# Patient Record
Sex: Male | Born: 1990 | Race: White | Hispanic: No | Marital: Single | Smoking: Current some day smoker
Health system: Southern US, Community
[De-identification: ages and names within clinical notes are randomized; demographics above are authoritative.]

## PROBLEM LIST (undated history)

## (undated) DIAGNOSIS — S069XAA Unspecified intracranial injury with loss of consciousness status unknown, initial encounter: Secondary | ICD-10-CM

## (undated) DIAGNOSIS — F4310 Post-traumatic stress disorder, unspecified: Secondary | ICD-10-CM

## (undated) DIAGNOSIS — W3400XA Accidental discharge from unspecified firearms or gun, initial encounter: Secondary | ICD-10-CM

## (undated) DIAGNOSIS — T8859XA Other complications of anesthesia, initial encounter: Secondary | ICD-10-CM

## (undated) DIAGNOSIS — N289 Disorder of kidney and ureter, unspecified: Secondary | ICD-10-CM

## (undated) DIAGNOSIS — C8590 Non-Hodgkin lymphoma, unspecified, unspecified site: Secondary | ICD-10-CM

## (undated) DIAGNOSIS — IMO0002 Reserved for concepts with insufficient information to code with codable children: Secondary | ICD-10-CM

## (undated) DIAGNOSIS — G8921 Chronic pain due to trauma: Secondary | ICD-10-CM

## (undated) DIAGNOSIS — S069X9A Unspecified intracranial injury with loss of consciousness of unspecified duration, initial encounter: Secondary | ICD-10-CM

## (undated) DIAGNOSIS — T4145XA Adverse effect of unspecified anesthetic, initial encounter: Secondary | ICD-10-CM

## (undated) HISTORY — DX: Reserved for concepts with insufficient information to code with codable children: IMO0002

## (undated) HISTORY — PX: HEAD & NECK SKIN LESION EXCISIONAL BIOPSY: SUR472

## (undated) HISTORY — PX: OTHER SURGICAL HISTORY: SHX169

---

## 2009-08-01 ENCOUNTER — Emergency Department (HOSPITAL_COMMUNITY): Admission: EM | Admit: 2009-08-01 | Discharge: 2009-08-01 | Payer: Self-pay | Admitting: Family Medicine

## 2009-08-08 ENCOUNTER — Encounter: Admission: RE | Admit: 2009-08-08 | Discharge: 2009-09-08 | Payer: Self-pay | Admitting: Surgery

## 2011-05-12 ENCOUNTER — Emergency Department (HOSPITAL_COMMUNITY)

## 2011-05-12 ENCOUNTER — Emergency Department (HOSPITAL_COMMUNITY)
Admission: EM | Admit: 2011-05-12 | Discharge: 2011-05-12 | Disposition: A | Discharge status: Home / Self Care | Attending: Emergency Medicine | Admitting: Emergency Medicine

## 2011-05-12 DIAGNOSIS — R221 Localized swelling, mass and lump, neck: Secondary | ICD-10-CM | POA: Insufficient documentation

## 2011-05-12 DIAGNOSIS — R220 Localized swelling, mass and lump, head: Secondary | ICD-10-CM | POA: Insufficient documentation

## 2011-05-12 DIAGNOSIS — F4310 Post-traumatic stress disorder, unspecified: Secondary | ICD-10-CM | POA: Insufficient documentation

## 2011-05-12 DIAGNOSIS — R599 Enlarged lymph nodes, unspecified: Secondary | ICD-10-CM | POA: Insufficient documentation

## 2011-05-12 DIAGNOSIS — G4700 Insomnia, unspecified: Secondary | ICD-10-CM | POA: Insufficient documentation

## 2011-05-12 LAB — COMPREHENSIVE METABOLIC PANEL
Alkaline Phosphatase: 215 U/L — ABNORMAL HIGH (ref 39–117)
BUN: 12 mg/dL (ref 6–23)
CO2: 30 mEq/L (ref 19–32)
GFR calc non Af Amer: >60 mL/min (ref >60)
Glucose, Bld: 95 mg/dL (ref 70–99)
Potassium: 4.1 mEq/L (ref 3.5–5.1)
Total Bilirubin: 0.4 mg/dL (ref 0.3–1.2)
Total Protein: 9.1 g/dL — ABNORMAL HIGH (ref 6.0–8.3)

## 2011-05-12 LAB — DIFFERENTIAL
Eosinophils Relative: 1 % (ref 0–5)
Lymphocytes Relative: 9 % — ABNORMAL LOW (ref 12–46)
Lymphs Abs: 1.8 10*3/uL (ref 0.7–4.0)
Monocytes Absolute: 1.4 10*3/uL — ABNORMAL HIGH (ref 0.1–1.0)

## 2011-05-12 LAB — CBC
HCT: 42.8 % (ref 39.0–52.0)
MCHC: 36.7 g/dL — ABNORMAL HIGH (ref 30.0–36.0)
MCV: 87.7 fL (ref 78.0–100.0)
RDW: 13.6 % (ref 11.5–15.5)
WBC: 20.9 10*3/uL — ABNORMAL HIGH (ref 4.0–10.5)

## 2011-05-12 MED ORDER — IOHEXOL 300 MG/ML  SOLN
75.0000 mL | Freq: Once | INTRAMUSCULAR | Status: AC | PRN
  Administered 2011-05-12: 75 mL via INTRAVENOUS

## 2011-05-12 MED ORDER — IOHEXOL 300 MG/ML  SOLN: 75.0000 mL | Freq: Once | INTRAMUSCULAR | Status: DC | PRN

## 2011-05-22 ENCOUNTER — Ambulatory Visit: Payer: Self-pay | Admitting: Hematology & Oncology

## 2011-06-06 ENCOUNTER — Ambulatory Visit (HOSPITAL_BASED_OUTPATIENT_CLINIC_OR_DEPARTMENT_OTHER): Payer: Non-veteran care | Admitting: Hematology & Oncology

## 2011-06-06 DIAGNOSIS — C8448 Peripheral T-cell lymphoma, not classified, lymph nodes of multiple sites: Secondary | ICD-10-CM

## 2011-06-06 DIAGNOSIS — F4310 Post-traumatic stress disorder, unspecified: Secondary | ICD-10-CM

## 2011-06-06 DIAGNOSIS — Z807 Family history of other malignant neoplasms of lymphoid, hematopoietic and related tissues: Secondary | ICD-10-CM

## 2011-06-15 ENCOUNTER — Ambulatory Visit (HOSPITAL_COMMUNITY): Payer: Non-veteran care | Attending: Hematology & Oncology

## 2011-06-15 ENCOUNTER — Other Ambulatory Visit: Payer: Self-pay | Admitting: Hematology & Oncology

## 2011-06-15 DIAGNOSIS — C8589 Other specified types of non-Hodgkin lymphoma, extranodal and solid organ sites: Secondary | ICD-10-CM | POA: Insufficient documentation

## 2011-06-15 LAB — CBC
MCV: 86.5 fL (ref 78.0–100.0)
Platelets: 278 10*3/uL (ref 150–400)
RBC: 4.96 MIL/uL (ref 4.22–5.81)
WBC: 18.6 10*3/uL — ABNORMAL HIGH (ref 4.0–10.5)

## 2011-06-15 LAB — DIFFERENTIAL
Lymphocytes Relative: 11 % — ABNORMAL LOW (ref 12–46)
Lymphs Abs: 2 10*3/uL (ref 0.7–4.0)
Neutro Abs: 15.2 10*3/uL — ABNORMAL HIGH (ref 1.7–7.7)
Neutrophils Relative %: 82 % — ABNORMAL HIGH (ref 43–77)

## 2011-06-26 ENCOUNTER — Encounter (HOSPITAL_BASED_OUTPATIENT_CLINIC_OR_DEPARTMENT_OTHER): Payer: Non-veteran care | Admitting: Hematology & Oncology

## 2011-06-26 DIAGNOSIS — F4310 Post-traumatic stress disorder, unspecified: Secondary | ICD-10-CM

## 2011-06-26 DIAGNOSIS — IMO0002 Reserved for concepts with insufficient information to code with codable children: Secondary | ICD-10-CM

## 2011-06-30 NOTE — Op Note (Signed)
  NAMEWITTEN, CERTAIN                 ACCOUNT NO.:  [id number]  MEDICAL RECORD NO.:  [id number]  LOCATION:                                 FACILITY:  PHYSICIAN:  Josph Macho, M.D.  DATE OF BIRTH:  05/05/1991  DATE OF PROCEDURE:  06/15/2011 DATE OF DISCHARGE:                              OPERATIVE REPORT   NATURE OF PROCEDURE:  Left posterior iliac crest bone marrow biopsy and aspirate.  Mr. Veith was brought to short-stay unit.  He is a 20 year old gentleman with PTSD.  He has recent diagnosis of anaplastic large cell lymphoma.  He had an IV placed in his, I think, right arm without any difficulties.  He was placed on to his right side.  His Mallampati score was 1.  His ASA class was 1.  We did the appropriate time-out procedure.  We then used a total of 50 mg of Demerol and 13 mg of Versed for IV sedation.  This really had very little sedative effect on Mr. Anise Salvo.  The left posterior iliac crest region was prepped and draped in sterile fashion.  A 10 cc of 2% lidocaine was infiltrated under the skin down to the periosteum.  A #11 scalpel was used to make an incision to the skin.  We just used a Jamshidi biopsy needle to obtain both aspirates and cores.  We did obtain 2 aspirates without difficulty.  We also obtained excellent biopsy core.  The procedure site was cleaned and dressed in a sterile fashion.  Mr. Arnesen tolerated the procedure well.     Josph Macho, M.D.     PRE/MEDQ  D:  06/15/2011  T:  06/15/2011  Job:  [id number]  Electronically Signed by Arlan Organ  on 06/30/2011 09:02:11 AM

## 2011-09-21 ENCOUNTER — Encounter (HOSPITAL_BASED_OUTPATIENT_CLINIC_OR_DEPARTMENT_OTHER): Payer: Non-veteran care | Admitting: Hematology & Oncology

## 2011-09-21 ENCOUNTER — Other Ambulatory Visit: Payer: Self-pay | Admitting: Hematology & Oncology

## 2011-09-21 DIAGNOSIS — C8590 Non-Hodgkin lymphoma, unspecified, unspecified site: Secondary | ICD-10-CM

## 2011-09-21 DIAGNOSIS — Z807 Family history of other malignant neoplasms of lymphoid, hematopoietic and related tissues: Secondary | ICD-10-CM

## 2011-09-21 DIAGNOSIS — IMO0002 Reserved for concepts with insufficient information to code with codable children: Secondary | ICD-10-CM

## 2011-09-21 DIAGNOSIS — F4310 Post-traumatic stress disorder, unspecified: Secondary | ICD-10-CM

## 2011-09-25 ENCOUNTER — Encounter (HOSPITAL_COMMUNITY)
Admission: RE | Admit: 2011-09-25 | Discharge: 2011-09-25 | Disposition: A | Discharge status: Home / Self Care | Payer: Non-veteran care | Source: Ambulatory Visit | Attending: Hematology & Oncology | Admitting: Hematology & Oncology

## 2011-09-25 DIAGNOSIS — R599 Enlarged lymph nodes, unspecified: Secondary | ICD-10-CM | POA: Insufficient documentation

## 2011-09-25 DIAGNOSIS — C8590 Non-Hodgkin lymphoma, unspecified, unspecified site: Secondary | ICD-10-CM

## 2011-09-25 DIAGNOSIS — C8589 Other specified types of non-Hodgkin lymphoma, extranodal and solid organ sites: Secondary | ICD-10-CM | POA: Insufficient documentation

## 2011-09-25 LAB — GLUCOSE, CAPILLARY: Glucose-Capillary: 84 mg/dL (ref 70–99)

## 2011-09-25 MED ORDER — FLUDEOXYGLUCOSE F - 18 (FDG) INJECTION
18.4000 | Freq: Once | INTRAVENOUS | Status: AC | PRN
  Administered 2011-09-25: 18.4 via INTRAVENOUS

## 2011-10-31 ENCOUNTER — Encounter: Payer: Self-pay | Admitting: Hematology & Oncology

## 2011-10-31 NOTE — Progress Notes (Signed)
Mailed orders to [facility] in [place].

## 2011-11-06 ENCOUNTER — Telehealth: Payer: Self-pay | Admitting: *Deleted

## 2011-11-06 NOTE — Telephone Encounter (Signed)
Pt's mother called yesterday inquiring about records that were supposed to have been mailed to her. She has not received them as of yet. Also wanted to let Dr Myna Hidalgo know that Andrew Dalton wants to have another PET Scan done in Dec.   1645: Returned Andrew Dalton's call. Left message on voicemail. Stated that records will be re-mailed (Pet Scan & office note) but per Dr Myna Hidalgo the PET Scan may not be covered by the [facility] as he is not actively undergoing treatment & had a scan done in Oct. Can try to submit request to the [facility] to see if they will approve it. Asked that she call back tomorrow to discuss further.

## 2011-11-14 ENCOUNTER — Encounter: Payer: Self-pay | Admitting: Hematology & Oncology

## 2011-11-14 NOTE — Progress Notes (Signed)
Mailed physician order to [facility] on 11-14-11

## 2011-12-10 ENCOUNTER — Telehealth: Payer: Self-pay | Admitting: *Deleted

## 2011-12-10 NOTE — Telephone Encounter (Signed)
Mother called wanted pt to get PET scan. Amy RN aware

## 2011-12-28 ENCOUNTER — Telehealth: Payer: Self-pay | Admitting: *Deleted

## 2011-12-28 NOTE — Telephone Encounter (Signed)
Pt's mother Gavin Pound called asking to have Undrea's most recent PET Scan placed on CD because he has an appt with another oncologist on the 29th in [place]. She stated she did this because she called 3 times and did not receive a call back with her request to have another PET Scan done. Explained to her that her call was returned & that a message was left on her phone asking her to call back so the PET Scan could be further discussed. Dr. Myna Hidalgo had some concerns that it may not be covered as he is not receiving any chemotherapy and since she had multiple contacts through the [place] & local legislatures, it would be best served to try to get the request submitted correctly the 1st time around. She then stated "let me stop you right there. I called the [facility] and got the authorization for the PET in like 2 minutes and asked for another oncologist because I figured since no one was calling me back Dr Myna Hidalgo wasn't going to order it because he wasn't making any money". Reassured her that her call was returned and that the last thing Dr Myna Hidalgo cares about is the money. He is here to support his patients in any way possible. She then stated that "he is the world's best doctor and I would love to know what he thinks about his next PET Scan".  Instructed her to obtain a copy of his PET and bring it by the office and Dr Myna Hidalgo would take a look at it after his visit with the MD in [place]. Gavin Pound went on to discuss how hard it is to deal with her son who wakes up every morning upset that he is still alive. Discussed and validated her feelings and told her that she can pick up his CD on Monday from Radiology at [facility].

## 2012-01-04 ENCOUNTER — Other Ambulatory Visit: Payer: Self-pay | Admitting: Radiology

## 2012-01-04 ENCOUNTER — Encounter: Payer: Self-pay | Admitting: *Deleted

## 2012-01-04 ENCOUNTER — Other Ambulatory Visit: Payer: Self-pay | Admitting: Hematology & Oncology

## 2012-01-04 DIAGNOSIS — C8590 Non-Hodgkin lymphoma, unspecified, unspecified site: Secondary | ICD-10-CM

## 2012-01-04 NOTE — Progress Notes (Signed)
Received a call early today from the pt's mother Gavin Pound stating that Hospice is requesting a port placement ASAP to aid in pain control. They are having a difficult time controlling his pain on his oral meds. They want to start him on an IV drip. Contacted Tina in IR at [facility]. They are unable to do it today but can get him in on Monday. She will call Ms. Lacher and give her the instructions. I discussed Kyzer's needs with Inetta Fermo in regards to his PTSD and TBI.  Inetta Fermo will place the orders in the computer for Dr Myna Hidalgo to sign off on for Monday. Faxed form to [facility] for approval but Reece Levy stated that if it wasn't approved, it didn't matter, he also had [facility] or "I will pay cash if I have to".

## 2012-01-07 ENCOUNTER — Encounter (HOSPITAL_COMMUNITY): Payer: Self-pay

## 2012-01-07 ENCOUNTER — Other Ambulatory Visit: Payer: Self-pay | Admitting: Hematology & Oncology

## 2012-01-07 ENCOUNTER — Ambulatory Visit (HOSPITAL_COMMUNITY)
Admission: RE | Admit: 2012-01-07 | Discharge: 2012-01-07 | Disposition: A | Discharge status: Home / Self Care | Payer: Non-veteran care | Source: Ambulatory Visit | Attending: Hematology & Oncology | Admitting: Hematology & Oncology

## 2012-01-07 ENCOUNTER — Encounter: Payer: Self-pay | Admitting: *Deleted

## 2012-01-07 DIAGNOSIS — C8589 Other specified types of non-Hodgkin lymphoma, extranodal and solid organ sites: Secondary | ICD-10-CM | POA: Insufficient documentation

## 2012-01-07 DIAGNOSIS — C8590 Non-Hodgkin lymphoma, unspecified, unspecified site: Secondary | ICD-10-CM

## 2012-01-07 DIAGNOSIS — Z880 Allergy status to penicillin: Secondary | ICD-10-CM | POA: Insufficient documentation

## 2012-01-07 HISTORY — DX: Unspecified intracranial injury with loss of consciousness of unspecified duration, initial encounter: S06.9X9A

## 2012-01-07 HISTORY — DX: Unspecified intracranial injury with loss of consciousness status unknown, initial encounter: S06.9XAA

## 2012-01-07 HISTORY — DX: Accidental discharge from unspecified firearms or gun, initial encounter: W34.00XA

## 2012-01-07 HISTORY — DX: Chronic pain due to trauma: G89.21

## 2012-01-07 HISTORY — DX: Post-traumatic stress disorder, unspecified: F43.10

## 2012-01-07 HISTORY — DX: Non-Hodgkin lymphoma, unspecified, unspecified site: C85.90

## 2012-01-07 LAB — CBC
MCH: 29.2 pg (ref 26.0–34.0)
Platelets: 274 10*3/uL (ref 150–400)
RBC: 4.76 MIL/uL (ref 4.22–5.81)
RDW: 13 % (ref 11.5–15.5)
WBC: 8.2 10*3/uL (ref 4.0–10.5)

## 2012-01-07 LAB — APTT: aPTT: 34 seconds (ref 24–37)

## 2012-01-07 LAB — PROTIME-INR: Prothrombin Time: 15.7 seconds — ABNORMAL HIGH (ref 11.6–15.2)

## 2012-01-07 MED ORDER — SODIUM CHLORIDE 0.9 % IV SOLN
INTRAVENOUS | Status: DC
  Administered 2012-01-07: 14:00:00 via INTRAVENOUS

## 2012-01-07 MED ORDER — FENTANYL CITRATE 0.05 MG/ML IJ SOLN
INTRAMUSCULAR | Status: AC | PRN
  Administered 2012-01-07 (×3): 100 ug via INTRAVENOUS

## 2012-01-07 MED ORDER — VANCOMYCIN HCL IN DEXTROSE 1-5 GM/200ML-% IV SOLN
1000.0000 mg | INTRAVENOUS | Status: AC
Start: 2012-01-07 — End: 2012-01-07
  Administered 2012-01-07: 1000 mg via INTRAVENOUS
  Filled 2012-01-07: qty 200

## 2012-01-07 MED ORDER — HYDROMORPHONE HCL PF 1 MG/ML IJ SOLN
INTRAMUSCULAR | Status: AC | PRN
  Administered 2012-01-07 (×2): 2 mg via INTRAVENOUS

## 2012-01-07 MED ORDER — HYDROMORPHONE HCL PF 2 MG/ML IJ SOLN
INTRAMUSCULAR | Status: AC
  Filled 2012-01-07: qty 1

## 2012-01-07 MED ORDER — MIDAZOLAM HCL 2 MG/2ML IJ SOLN
INTRAMUSCULAR | Status: AC
  Filled 2012-01-07: qty 6

## 2012-01-07 MED ORDER — FENTANYL CITRATE 0.05 MG/ML IJ SOLN
INTRAMUSCULAR | Status: AC
  Filled 2012-01-07: qty 6

## 2012-01-07 MED ORDER — LIDOCAINE HCL 1 % IJ SOLN
INTRAMUSCULAR | Status: AC
  Filled 2012-01-07: qty 20

## 2012-01-07 MED ORDER — DIPHENHYDRAMINE HCL 50 MG/ML IJ SOLN
INTRAMUSCULAR | Status: AC
  Filled 2012-01-07: qty 1

## 2012-01-07 MED ORDER — MIDAZOLAM HCL 5 MG/5ML IJ SOLN
INTRAMUSCULAR | Status: AC | PRN
  Administered 2012-01-07 (×3): 2 mg via INTRAVENOUS
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
## 2012-01-07 NOTE — Procedures (Signed)
Placement of portacath.  Tip at cavoatrial junction.  No immediate complication.

## 2012-01-07 NOTE — Progress Notes (Signed)
Pt's mother Gavin Pound called and spoke to [facility] regarding the IV Dilaudid for [place]. She wanted to know if Dr Myna Hidalgo was going to be writing the order. He is going to be home from his port placement around 4 pm and wants to make sure it is there for when he gets home. Spoke to [facility]. They have arranged for drug delivery through their NP. She will give Gavin Pound a call to reassure her of the plans.

## 2012-02-01 ENCOUNTER — Telehealth: Payer: Self-pay | Admitting: Hematology & Oncology

## 2012-02-01 ENCOUNTER — Other Ambulatory Visit: Payer: Self-pay | Admitting: Hematology & Oncology

## 2012-02-01 DIAGNOSIS — IMO0002 Reserved for concepts with insufficient information to code with codable children: Secondary | ICD-10-CM

## 2012-02-01 NOTE — Telephone Encounter (Signed)
Pt aware of 02-12-12 appointment. Andrew Dalton is aware need to pre cert through [place]

## 2012-02-04 ENCOUNTER — Other Ambulatory Visit: Payer: Self-pay | Admitting: *Deleted

## 2012-02-04 DIAGNOSIS — J329 Chronic sinusitis, unspecified: Secondary | ICD-10-CM

## 2012-02-04 MED ORDER — SULFAMETHOXAZOLE-TRIMETHOPRIM 800-160 MG PO TABS: 1.0000 | ORAL_TABLET | Freq: Two times a day (BID) | ORAL | Status: DC

## 2012-02-04 NOTE — Telephone Encounter (Signed)
Received a call from the pt's mother Gavin Pound stating she thinks he has a sinus infection. Wants to know if Dr Myna Hidalgo thinks he should be on something before he starts chemotherapy. Reviewed with Dr Myna Hidalgo. To start Bactrim DS 1 po BID x 10 days. Esprescribed to [facility].

## 2012-02-12 ENCOUNTER — Other Ambulatory Visit (HOSPITAL_BASED_OUTPATIENT_CLINIC_OR_DEPARTMENT_OTHER): Payer: Non-veteran care | Admitting: Lab

## 2012-02-12 ENCOUNTER — Other Ambulatory Visit: Payer: Self-pay | Admitting: *Deleted

## 2012-02-12 ENCOUNTER — Encounter: Payer: Self-pay | Admitting: Hematology & Oncology

## 2012-02-12 ENCOUNTER — Ambulatory Visit (HOSPITAL_BASED_OUTPATIENT_CLINIC_OR_DEPARTMENT_OTHER): Payer: Non-veteran care | Admitting: Hematology & Oncology

## 2012-02-12 ENCOUNTER — Ambulatory Visit: Payer: Non-veteran care

## 2012-02-12 ENCOUNTER — Ambulatory Visit (HOSPITAL_BASED_OUTPATIENT_CLINIC_OR_DEPARTMENT_OTHER): Payer: Non-veteran care

## 2012-02-12 VITALS — BP 127/75 | HR 96 | Temp 97.6°F | Ht 72.0 in | Wt 165.0 lb

## 2012-02-12 DIAGNOSIS — IMO0002 Reserved for concepts with insufficient information to code with codable children: Secondary | ICD-10-CM

## 2012-02-12 DIAGNOSIS — Z5111 Encounter for antineoplastic chemotherapy: Secondary | ICD-10-CM

## 2012-02-12 HISTORY — DX: Reserved for concepts with insufficient information to code with codable children: IMO0002

## 2012-02-12 LAB — COMPREHENSIVE METABOLIC PANEL
ALT: 32 U/L (ref 0–53)
AST: 25 U/L (ref 0–37)
Albumin: 4.8 g/dL (ref 3.5–5.2)
Alkaline Phosphatase: 149 U/L — ABNORMAL HIGH (ref 39–117)
Glucose, Bld: 133 mg/dL — ABNORMAL HIGH (ref 70–99)
Potassium: 3.5 mEq/L (ref 3.5–5.3)
Sodium: 139 mEq/L (ref 135–145)
Total Protein: 8.2 g/dL (ref 6.0–8.3)

## 2012-02-12 LAB — CBC WITH DIFFERENTIAL (CANCER CENTER ONLY)
BASO%: 0.4 % (ref 0.0–2.0)
Eosinophils Absolute: 0.3 10*3/uL (ref 0.0–0.5)
MCH: 29.2 pg (ref 28.0–33.4)
MONO%: 6.6 % (ref 0.0–13.0)
NEUT#: 3.5 10*3/uL (ref 1.5–6.5)
Platelets: 205 10*3/uL (ref 145–400)
RBC: 4.8 10*6/uL (ref 4.20–5.70)
RDW: 13.6 % (ref 11.1–15.7)
WBC: 5.6 10*3/uL (ref 4.0–10.0)

## 2012-02-12 LAB — PREALBUMIN: Prealbumin: 30.4 mg/dL (ref 17.0–34.0)

## 2012-02-12 MED ORDER — ALPRAZOLAM 1 MG PO TABS: ORAL_TABLET | ORAL | Status: DC

## 2012-02-12 MED ORDER — SODIUM CHLORIDE 0.9 % IV SOLN
150.0000 mg | Freq: Once | INTRAVENOUS | Status: AC
  Administered 2012-02-12: 150 mg via INTRAVENOUS
  Filled 2012-02-12: qty 5

## 2012-02-12 MED ORDER — ZOLPIDEM TARTRATE ER 12.5 MG PO TBCR: 12.5000 mg | EXTENDED_RELEASE_TABLET | Freq: Every evening | ORAL | Status: DC | PRN

## 2012-02-12 MED ORDER — VINCRISTINE SULFATE CHEMO INJECTION 1 MG/ML
2.0000 mg | Freq: Once | INTRAVENOUS | Status: AC
  Administered 2012-02-12: 2 mg via INTRAVENOUS
  Filled 2012-02-12: qty 2

## 2012-02-12 MED ORDER — PROMETHAZINE HCL 25 MG PO TABS: 12.5000 mg | ORAL_TABLET | Freq: Four times a day (QID) | ORAL | Status: DC | PRN

## 2012-02-12 MED ORDER — SODIUM CHLORIDE 0.9 % IV SOLN: Freq: Once | INTRAVENOUS | Status: DC

## 2012-02-12 MED ORDER — ONDANSETRON 16 MG/50ML IVPB (CHCC)
16.0000 mg | Freq: Once | INTRAVENOUS | Status: DC
  Administered 2012-02-12: 16 mg via INTRAVENOUS

## 2012-02-12 MED ORDER — DEXAMETHASONE SODIUM PHOSPHATE 4 MG/ML IJ SOLN
12.0000 mg | Freq: Once | INTRAMUSCULAR | Status: AC
  Administered 2012-02-12: 12 mg via INTRAVENOUS

## 2012-02-12 MED ORDER — DOXORUBICIN HCL CHEMO IV INJECTION 2 MG/ML
40.0000 mg/m2 | Freq: Once | INTRAVENOUS | Status: AC
  Administered 2012-02-12: 78 mg via INTRAVENOUS
  Filled 2012-02-12: qty 39

## 2012-02-12 MED ORDER — ONDANSETRON HCL 8 MG PO TABS: ORAL_TABLET | ORAL | Status: DC

## 2012-02-12 MED ORDER — PALONOSETRON HCL INJECTION 0.25 MG/5ML
0.2500 mg | Freq: Once | INTRAVENOUS | Status: AC
  Administered 2012-02-12: 0.25 mg via INTRAVENOUS

## 2012-02-12 MED ORDER — SODIUM CHLORIDE 0.9 % IJ SOLN
10.0000 mL | INTRAMUSCULAR | Status: DC | PRN
  Filled 2012-02-12: qty 10

## 2012-02-12 MED ORDER — PREDNISONE 20 MG PO TABS: 60.0000 mg | ORAL_TABLET | Freq: Every day | ORAL | Status: AC

## 2012-02-12 MED ORDER — HEPARIN SOD (PORK) LOCK FLUSH 100 UNIT/ML IV SOLN
500.0000 [IU] | Freq: Once | INTRAVENOUS | Status: DC | PRN
  Filled 2012-02-12: qty 5

## 2012-02-12 MED ORDER — SODIUM CHLORIDE 0.9 % IV SOLN
600.0000 mg/m2 | Freq: Once | INTRAVENOUS | Status: AC
  Administered 2012-02-12: 1180 mg via INTRAVENOUS
  Filled 2012-02-12: qty 59

## 2012-02-12 MED ORDER — LIDOCAINE-PRILOCAINE 2.5-2.5 % EX CREA: TOPICAL_CREAM | CUTANEOUS | Status: DC

## 2012-02-12 NOTE — Progress Notes (Signed)
This office note has been dictated.

## 2012-02-12 NOTE — Patient Instructions (Signed)
[facility] Discharge Instructions for Patients Receiving Chemotherapy  Today you received the following chemotherapy agents Adriamycin, Vincristine, Cytoxan. To take Prednisone at home starting the day of chemotherapy and continue for 5 days.   To help prevent nausea and vomiting after your treatment, we encourage you to take your nausea medication:  Zofran 8mg every 12 hours starting 2 days after chemo for 2 days then every 12 hours as needed for nausea or vomiting  Phenergan 12.5 mg or (1/2 of a 25 mg tab) every 6 hours as needed for nausea. Can take immediately after chemo.  EMLA Cream Apply quarter size application to Port site 1 hour prior to chemotherapy. Do NOT rub in. Cover area with plastic wrap.   If you develop nausea and vomiting that is not controlled by your nausea medication, call the clinic. If it is after clinic hours your family physician or the after hours number for the clinic or go to the Emergency Department.   BELOW ARE SYMPTOMS THAT SHOULD BE REPORTED IMMEDIATELY:  *FEVER GREATER THAN 100.5 F  *CHILLS WITH OR WITHOUT FEVER  NAUSEA AND VOMITING THAT IS NOT CONTROLLED WITH YOUR NAUSEA MEDICATION  *UNUSUAL SHORTNESS OF BREATH  *UNUSUAL BRUISING OR BLEEDING  TENDERNESS IN MOUTH AND THROAT WITH OR WITHOUT PRESENCE OF ULCERS  *URINARY PROBLEMS  *BOWEL PROBLEMS  UNUSUAL RASH Items with * indicate a potential emergency and should be followed up as soon as possible.  One of the nurses will contact you 24 hours after your treatment. Please let the nurse know about any problems that you may have experienced. Feel free to call the clinic you have any questions or concerns. The clinic phone number is [phone].   I have been informed and understand all the instructions given to me. I know to contact the clinic, my physician, or go to the Emergency Department if any problems should occur. I do not have any questions at this time, but understand  that I may call the clinic during office hours   should I have any questions or need assistance in obtaining follow up care.    __________________________________________          _____________     __________ Signature of Patient                                                                     Date                   Time    __________________________________________ Nurse's Signature    

## 2012-02-13 NOTE — Progress Notes (Signed)
CC:   Jana Hakim, MD, Fax [phone]  DIAGNOSIS:  Anaplastic large-cell non-Hodgkin lymphoma, T-cell, ALK positive.  CURRENT THERAPY:  Patient to start her chemotherapy today with CHOP.  INTERIM HISTORY:  Mr. Seufert comes in for a visit.  We last saw him back in October.  He at that point in time was adamant about not getting treated.  He is doing so much better now.  He actually has been followed by hospice.  He is on a Dilaudid pump at 4.5 mg an hour.  He does have some chronic pain issues secondary to lymphoma.  Shockingly enough, when we repeated his last PET scan, his lymphoma actually was improving.  The PET scan was done on February 12th.  The PET scan showed interval decrease in his right cervical lymph nodes. There were enlarged lymph nodes in the right axilla which also appear to have regressed.  There was  some resolution of some previously-described right peritracheal and anterior mediastinal lymph nodes.  There was no evidence of activity within the abdomen or pelvis.  I am just absolutely amazed as to how God has worked in his life. Clearly, Mr. Andujo is meant to still be with [place] and the fact that his lymphoma has not progressed is a clear sign of this.  As such, Mr. Arndt has decided to take therapy.  He is in a great frame of mind for therapy.  He has not had any problems with cough.  He does have some abdominal discomfort.  He is on laxatives to try to keep going.  He has had some arthralgias and myalgias.  He has had no arm or leg swelling.  He has had no fever.  He has had no bleeding.  He does have some mouth soreness.  He is trying to be diligent with taking care of his oral hygiene.  PHYSICAL EXAMINATION:  This is a well-developed, well-nourished white gentleman in no obvious distress.  Vital signs:  Temperature 97.6, pulse 96, respiratory rate 16, blood pressure 127/75.  Weight is 165.  Head and neck:  Shows a normocephalic, atraumatic skull.   There are no ocular or oral lesions.  There are no palpable cervical or supraclavicular lymph nodes.  He has some fullness in the right neck.  There is some tenderness to palpation in the right neck.  I cannot palpate any supraclavicular lymph nodes.  Axillary exam does show enlarged right axillary lymph nodes.  These are firm, tender and nonmobile.  The lymph nodes probably measure about 3 x 3 cm.  I cannot feel any left axillary lymph nodes.  Lungs:  Clear bilaterally.  Cardiac:  Regular rate and rhythm with a normal S1 and S2.  There are no murmurs, rubs or bruits. Abdomen:  Soft with good bowel sounds.  There is no palpable abdominal mass.  There is no fluid wave.  There is no palpable hepatosplenomegaly. Back:  No tenderness over the spine, ribs, or hips.  Extremities:  Show no clubbing, cyanosis or edema.  He has good range motion of his joints. Neurologic:  Shows no focal neurological deficits.  LABORATORY STUDIES:  White cell count is 5.6, hemoglobin 14, hematocrit 40, platelet count 205.  IMPRESSION:  Mr. Cravens is an amazing 21 year old gentleman with anaplastic large-cell non-Hodgkin lymphoma.  He was diagnosed back I think in April or May of last year.  Without any treatment, his disease has not progressed.  He is ready for treatment now.  We will go ahead and start  him on CHOP.  I am going to reduce his dose of Cytoxan and Adriamycin for the 1st cycle, just to get him through this without a lot of anguish.  He still could have a "change of heart" and if he gets ill with this first cycle of chemotherapy, he may not take any further.  If he does well with the dose's reduction, we will then increase back up to a full dose.  We will go ahead and plan to get him back in 3 weeks for followup.  He does not need Neulasta from my point of view.  Hopefully, we will be able to get him off his Dilaudid pump after this first cycle of chemo.  I do not want to do too much with him  that might set off his PTSD.  We want to try to just make this first cycle as "non- eventful" as possible.    ______________________________ Josph Macho, M.D. PRE/MEDQ  D:  02/12/2012  T:  02/12/2012  Job:  [id number]

## 2012-02-13 NOTE — Letter (Signed)
February 12, 2012    Physical Evaluation Board Attention [facility] Personal Board [address] [place] [address] [place], [place]  [address]  NAME:  Andrew Dalton MRN:  [id number] DOB:  01-Jan-1991  Dear Milford Cage or Estill Batten,  Mr. Andrew Dalton is a patient at the [facility] in [place], [place].  As you well know, Mr. Andrew Dalton has been diagnosed with an aggressive anaplastic large-cell lymphoma.  He was diagnosed with this last year.  Due to some personal issues, he was not able to undertake chemotherapy.  He now has started treatment for his lymphoma.  He is undergoing aggressive chemotherapy.  His chemotherapy is being given 1 day every 3 weeks.  I expect him to have at least 6 treatments to no more than 8 treatments.  The chemotherapy will affect Mr. Andrew Dalton with respect to his immune system and stamina.  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  Mr. Andrew Dalton also has experienced quite a lot of pain secondary to this lymphoma.  He is on a high amount of pain medications which he needs to have function.  Again, his limitations from the pain medications also have made it difficult for him to travel.  Most importantly is the fact that he is at risk for infection.  His blood counts will be adversely affected by chemotherapy and he really needs to be monitored closely for any evidence of infection.  Mr. Andrew Dalton will be followed very closely by our office.  We will be seeing him every 3 weeks, possibly even more often as he is going through his treatments.  Hopefully, with successful chemotherapy, we will be able to get his lymphoma into remission and I would like to expect that his lymphoma could be potentially cured.  If there is any additional information that is needed for Mr. Andrew Dalton, please feel free to let  me know at [phone].  Respectfully,   Josph Macho, M.D.  PRE/MEDQ  D:  02/12/2012  T:  02/12/2012  Job:  [id number]

## 2012-02-18 ENCOUNTER — Other Ambulatory Visit: Payer: Self-pay | Admitting: Oncology

## 2012-02-18 ENCOUNTER — Ambulatory Visit (HOSPITAL_BASED_OUTPATIENT_CLINIC_OR_DEPARTMENT_OTHER): Payer: Non-veteran care

## 2012-02-18 ENCOUNTER — Other Ambulatory Visit: Payer: Self-pay | Admitting: Hematology & Oncology

## 2012-02-18 DIAGNOSIS — R52 Pain, unspecified: Secondary | ICD-10-CM

## 2012-02-18 DIAGNOSIS — IMO0002 Reserved for concepts with insufficient information to code with codable children: Secondary | ICD-10-CM

## 2012-02-18 MED ORDER — HYDROMORPHONE HCL PF 4 MG/ML IJ SOLN
6.0000 mg | Freq: Once | INTRAMUSCULAR | Status: AC
  Administered 2012-02-18: 6 mg via INTRAVENOUS

## 2012-02-18 MED ORDER — HYDROMORPHONE HCL ER 32 MG PO T24A: 64.0000 mg | EXTENDED_RELEASE_TABLET | Freq: Every day | ORAL | Status: DC

## 2012-02-18 MED ORDER — HYDROMORPHONE HCL 8 MG PO TABS: ORAL_TABLET | ORAL | Status: DC

## 2012-02-19 ENCOUNTER — Other Ambulatory Visit: Payer: Self-pay | Admitting: *Deleted

## 2012-02-19 ENCOUNTER — Other Ambulatory Visit: Payer: Self-pay | Admitting: Hematology & Oncology

## 2012-02-19 ENCOUNTER — Encounter: Payer: Self-pay | Admitting: *Deleted

## 2012-02-19 DIAGNOSIS — IMO0002 Reserved for concepts with insufficient information to code with codable children: Secondary | ICD-10-CM

## 2012-02-19 MED ORDER — HYDROMORPHONE HCL ER 32 MG PO T24A: 64.0000 mg | EXTENDED_RELEASE_TABLET | Freq: Every day | ORAL | Status: DC

## 2012-02-19 NOTE — Progress Notes (Signed)
Pt's mother called.  They were not able to fill the rx last evening for Exalgo. Because the pharmacy did not have enough of the med. To fill the rx.  Mother called the MD on call and were told they could take the OxyContin that pt had previously been on along with the Dilaudid for breakthrough pain.  Mother reports that pt has not slept all night, is SOB and yawning quite a lot.  Per Dr. Myna Hidalgo this could be from withdrawal from his pain medication.  Dr. Myna Hidalgo instructed her to take Dilaudid 8mg  now.  Mother is getting Exalgo filled at [facility] and [employer] will pay for this.  All refills will have to go through the [place] in [facility].

## 2012-02-25 ENCOUNTER — Other Ambulatory Visit: Payer: Self-pay | Admitting: *Deleted

## 2012-02-25 DIAGNOSIS — IMO0002 Reserved for concepts with insufficient information to code with codable children: Secondary | ICD-10-CM

## 2012-02-25 MED ORDER — ZOLPIDEM TARTRATE ER 12.5 MG PO TBCR: 12.5000 mg | EXTENDED_RELEASE_TABLET | Freq: Every evening | ORAL | Status: DC | PRN

## 2012-02-25 NOTE — Telephone Encounter (Signed)
Reissued rx for Ambien CR 12.5 mg because it was non-formulary at the [place]. He has been taking this on a regular basis and the regular Ambien which is on the formulary no longer works for him. His mother Jameir Ake asked that it be called to [facility] at [phone] to be filed under his secondary insurance. Called as requested.

## 2012-02-29 ENCOUNTER — Encounter: Payer: Self-pay | Admitting: Hematology & Oncology

## 2012-03-05 ENCOUNTER — Other Ambulatory Visit (HOSPITAL_BASED_OUTPATIENT_CLINIC_OR_DEPARTMENT_OTHER): Payer: Non-veteran care | Admitting: Lab

## 2012-03-05 ENCOUNTER — Ambulatory Visit (HOSPITAL_BASED_OUTPATIENT_CLINIC_OR_DEPARTMENT_OTHER): Payer: Non-veteran care

## 2012-03-05 ENCOUNTER — Ambulatory Visit (HOSPITAL_BASED_OUTPATIENT_CLINIC_OR_DEPARTMENT_OTHER): Payer: Non-veteran care | Admitting: Hematology & Oncology

## 2012-03-05 VITALS — BP 142/85 | HR 98 | Temp 98.0°F | Ht 72.0 in | Wt 155.0 lb

## 2012-03-05 DIAGNOSIS — Z5111 Encounter for antineoplastic chemotherapy: Secondary | ICD-10-CM

## 2012-03-05 DIAGNOSIS — IMO0002 Reserved for concepts with insufficient information to code with codable children: Secondary | ICD-10-CM

## 2012-03-05 DIAGNOSIS — C8290 Follicular lymphoma, unspecified, unspecified site: Secondary | ICD-10-CM

## 2012-03-05 LAB — COMPREHENSIVE METABOLIC PANEL
ALT: 32 U/L (ref 0–53)
AST: 22 U/L (ref 0–37)
Alkaline Phosphatase: 103 U/L (ref 39–117)
Calcium: 10.1 mg/dL (ref 8.4–10.5)
Chloride: 100 mEq/L (ref 96–112)
Creatinine, Ser: 0.94 mg/dL (ref 0.50–1.35)
Potassium: 3.8 mEq/L (ref 3.5–5.3)

## 2012-03-05 LAB — CBC WITH DIFFERENTIAL (CANCER CENTER ONLY)
BASO%: 0.2 % (ref 0.0–2.0)
EOS%: 1.3 % (ref 0.0–7.0)
LYMPH#: 1.2 10*3/uL (ref 0.9–3.3)
MCHC: 35.4 g/dL (ref 32.0–35.9)
NEUT#: 2.7 10*3/uL (ref 1.5–6.5)
NEUT%: 58.2 % (ref 40.0–80.0)
RDW: 14.7 % (ref 11.1–15.7)

## 2012-03-05 MED ORDER — DEXAMETHASONE SODIUM PHOSPHATE 4 MG/ML IJ SOLN
12.0000 mg | Freq: Once | INTRAMUSCULAR | Status: AC
  Administered 2012-03-05: 12 mg via INTRAVENOUS

## 2012-03-05 MED ORDER — SODIUM CHLORIDE 0.9 % IJ SOLN
2.0000 mg | Freq: Once | INTRAMUSCULAR | Status: AC
  Administered 2012-03-05: 2 mg via INTRAVENOUS
  Filled 2012-03-05: qty 2

## 2012-03-05 MED ORDER — SODIUM CHLORIDE 0.9 % IV SOLN
750.0000 mg/m2 | Freq: Once | INTRAVENOUS | Status: AC
  Administered 2012-03-05: 1460 mg via INTRAVENOUS
  Filled 2012-03-05: qty 73

## 2012-03-05 MED ORDER — SODIUM CHLORIDE 0.9 % IV SOLN
Freq: Once | INTRAVENOUS | Status: AC
  Administered 2012-03-05: 14:00:00 via INTRAVENOUS

## 2012-03-05 MED ORDER — DOXORUBICIN HCL CHEMO IV INJECTION 2 MG/ML
50.0000 mg/m2 | Freq: Once | INTRAVENOUS | Status: AC
  Administered 2012-03-05: 98 mg via INTRAVENOUS
  Filled 2012-03-05: qty 49

## 2012-03-05 MED ORDER — PALONOSETRON HCL INJECTION 0.25 MG/5ML
0.2500 mg | Freq: Once | INTRAVENOUS | Status: AC
  Administered 2012-03-05: 0.25 mg via INTRAVENOUS

## 2012-03-05 MED ORDER — HEPARIN SOD (PORK) LOCK FLUSH 100 UNIT/ML IV SOLN
500.0000 [IU] | Freq: Once | INTRAVENOUS | Status: AC | PRN
  Administered 2012-03-05: 500 [IU]
  Filled 2012-03-05: qty 5

## 2012-03-05 MED ORDER — SODIUM CHLORIDE 0.9 % IV SOLN
150.0000 mg | Freq: Once | INTRAVENOUS | Status: AC
  Administered 2012-03-05: 150 mg via INTRAVENOUS
  Filled 2012-03-05: qty 5

## 2012-03-05 MED ORDER — SODIUM CHLORIDE 0.9 % IJ SOLN
10.0000 mL | INTRAMUSCULAR | Status: DC | PRN
  Administered 2012-03-05: 10 mL
  Filled 2012-03-05: qty 10

## 2012-03-05 NOTE — Progress Notes (Signed)
This office note has been dictated.

## 2012-03-06 ENCOUNTER — Telehealth: Payer: Self-pay | Admitting: Hematology & Oncology

## 2012-03-06 NOTE — Telephone Encounter (Signed)
Left message that MD wants Andrew Dalton to get PET scan at [facility] on 4-11 and that we sent the order to the [facility] for processing.

## 2012-03-06 NOTE — Progress Notes (Signed)
CC:   Andrew Hakim, MD, Fax [phone]  DIAGNOSIS:  Anaplastic large-cell lymphoma.  CURRENT THERAPY:  Status post 1 cycle of CHOP.  INTERIM HISTORY:  Andrew Dalton comes in for followup.  He is really doing great.  He tolerated his 1st cycle of chemotherapy very well.  He has noticed his lymph nodes have gone.  He is not hurting nearly as much. He is off his Dilaudid pump.  He is on oral Dilaudid and is very happy with this.  His quality of life has improved.  He only had 1 episode of emesis.  He has had no mouth sores.  He does have a little constipation. He has had no rashes.  He has had no bleeding.  He has had no fever.  His PTSD really has been under good control.  PHYSICAL EXAM:  General: This is a well-developed, well-nourished, white gentleman in no obvious distress.  Vital signs: Show temperature of 98, pulse 103 respond rate 16, blood pressure 142/85, and weight is 155. Head and neck exam shows a normocephalic, atraumatic skull.  There are no ocular or oral lesions.  There is no mucositis.  He has no adenopathy in his neck now.  There is no supraclavicular lymph nodes.  Lungs are clear bilaterally.  Cardiac exam; regular rate and rhythm with a normal S1 and S2.  There are no murmurs, rubs, or bruits.  Abdominal exam: Soft with good bowel sounds.  There is no fluid wave.  There is no palpable abdominal mass.  There is no palpable hepatosplenomegaly.  Axillary exam: Shows no bilateral axillary adenopathy.  There may be a less than 1 cm right axillary lymph node that is nontender and slightly mobile. He has no inguinal adenopathy bilaterally.  Extremities: Shows no clubbing, cyanosis, or edema.  Neurological exam: Shows no focal neurological deficits.  LABORATORY STUDIES:  White cell count is 4.6, hemoglobin 15.7, hematocrit 44.3, and platelet count 337.  Electrolytes show a BUN of 6, creatinine of 0.94.  Alkaline phosphatase is 119.  LDH is 141.  IMPRESSION:  Andrew Dalton  is a 21 year old gentleman with anaplastic large- cell lymphoma.  This is a T-cell lymphoma.  He has responded very nicely after 1 cycle of chemo with chop.  We will go ahead and plan for cycle 2 today.  We are going to have to set him up with another PET scan so that we can assess for response.  Clinically, I would have to suspect that he has responded very nicely.  We will get the PET scan set up for 2 weeks.  We will then plan to get him back for his 3rd cycle of chemotherapy in 3 weeks.  Hopefully, all we need to do is 6 cycles of chemo.    ______________________________ Josph Macho, M.D. PRE/MEDQ  D:  03/05/2012  T:  03/05/2012  Job:  [id number]

## 2012-03-10 ENCOUNTER — Telehealth: Payer: Self-pay | Admitting: Hematology & Oncology

## 2012-03-10 NOTE — Telephone Encounter (Signed)
Faxed request, last o/v note, and orders for PET scan to [facility] indicating that Dr. Myna Hidalgo ordered a PET scan for pt to be done at the [facility].

## 2012-03-11 ENCOUNTER — Emergency Department (HOSPITAL_COMMUNITY): Payer: Non-veteran care

## 2012-03-11 ENCOUNTER — Encounter (HOSPITAL_COMMUNITY): Payer: Self-pay | Admitting: *Deleted

## 2012-03-11 ENCOUNTER — Inpatient Hospital Stay (HOSPITAL_COMMUNITY)
Admission: EM | Admit: 2012-03-11 | Discharge: 2012-03-13 | DRG: 392 | Disposition: A | Discharge status: Home / Self Care | Payer: Non-veteran care | Attending: Hematology & Oncology | Admitting: Hematology & Oncology

## 2012-03-11 ENCOUNTER — Encounter: Payer: Self-pay | Admitting: *Deleted

## 2012-03-11 DIAGNOSIS — D72829 Elevated white blood cell count, unspecified: Secondary | ICD-10-CM

## 2012-03-11 DIAGNOSIS — C8590 Non-Hodgkin lymphoma, unspecified, unspecified site: Secondary | ICD-10-CM

## 2012-03-11 DIAGNOSIS — N179 Acute kidney failure, unspecified: Secondary | ICD-10-CM

## 2012-03-11 DIAGNOSIS — Z87820 Personal history of traumatic brain injury: Secondary | ICD-10-CM

## 2012-03-11 DIAGNOSIS — E860 Dehydration: Secondary | ICD-10-CM | POA: Diagnosis present

## 2012-03-11 DIAGNOSIS — IMO0002 Reserved for concepts with insufficient information to code with codable children: Secondary | ICD-10-CM

## 2012-03-11 DIAGNOSIS — E876 Hypokalemia: Secondary | ICD-10-CM

## 2012-03-11 DIAGNOSIS — C8589 Other specified types of non-Hodgkin lymphoma, extranodal and solid organ sites: Secondary | ICD-10-CM | POA: Diagnosis present

## 2012-03-11 DIAGNOSIS — R112 Nausea with vomiting, unspecified: Principal | ICD-10-CM

## 2012-03-11 HISTORY — DX: Adverse effect of unspecified anesthetic, initial encounter: T41.45XA

## 2012-03-11 HISTORY — DX: Disorder of kidney and ureter, unspecified: N28.9

## 2012-03-11 HISTORY — DX: Other complications of anesthesia, initial encounter: T88.59XA

## 2012-03-11 LAB — HEPATIC FUNCTION PANEL
ALT: 60 U/L — ABNORMAL HIGH (ref 0–53)
AST: 29 U/L (ref 0–37)
Bilirubin, Direct: 0.2 mg/dL (ref 0.0–0.3)
Indirect Bilirubin: 0.7 mg/dL (ref 0.3–0.9)
Total Bilirubin: 0.9 mg/dL (ref 0.3–1.2)

## 2012-03-11 LAB — CBC
HCT: 48.1 % (ref 39.0–52.0)
MCHC: 36.6 g/dL — ABNORMAL HIGH (ref 30.0–36.0)
RDW: 13.7 % (ref 11.5–15.5)

## 2012-03-11 LAB — URINALYSIS, ROUTINE W REFLEX MICROSCOPIC
Hgb urine dipstick: NEGATIVE
Nitrite: NEGATIVE
Specific Gravity, Urine: 1.03 (ref 1.005–1.030)
Urobilinogen, UA: 0.2 mg/dL (ref 0.0–1.0)

## 2012-03-11 LAB — COMPREHENSIVE METABOLIC PANEL
Albumin: 5.7 g/dL — ABNORMAL HIGH (ref 3.5–5.2)
Alkaline Phosphatase: 111 U/L (ref 39–117)
BUN: 20 mg/dL (ref 6–23)
Calcium: 11.3 mg/dL — ABNORMAL HIGH (ref 8.4–10.5)
Creatinine, Ser: 2.63 mg/dL — ABNORMAL HIGH (ref 0.50–1.35)
Potassium: 3.3 mEq/L — ABNORMAL LOW (ref 3.5–5.1)
Total Protein: 9.6 g/dL — ABNORMAL HIGH (ref 6.0–8.3)

## 2012-03-11 LAB — DIFFERENTIAL
Basophils Absolute: 0 10*3/uL (ref 0.0–0.1)
Eosinophils Absolute: 0 10*3/uL (ref 0.0–0.7)
Lymphocytes Relative: 3 % — ABNORMAL LOW (ref 12–46)
Neutro Abs: 18.3 10*3/uL — ABNORMAL HIGH (ref 1.7–7.7)
Neutrophils Relative %: 97 % — ABNORMAL HIGH (ref 43–77)

## 2012-03-11 LAB — URINE MICROSCOPIC-ADD ON

## 2012-03-11 LAB — LIPASE, BLOOD: Lipase: 20 U/L (ref 11–59)

## 2012-03-11 MED ORDER — ONDANSETRON HCL 4 MG PO TABS: 4.0000 mg | ORAL_TABLET | Freq: Four times a day (QID) | ORAL | Status: DC | PRN

## 2012-03-11 MED ORDER — ACETAMINOPHEN 650 MG RE SUPP: 650.0000 mg | Freq: Four times a day (QID) | RECTAL | Status: DC | PRN

## 2012-03-11 MED ORDER — ACETAMINOPHEN 325 MG PO TABS: 650.0000 mg | ORAL_TABLET | Freq: Four times a day (QID) | ORAL | Status: DC | PRN

## 2012-03-11 MED ORDER — SODIUM CHLORIDE 0.9 % IV BOLUS (SEPSIS)
1000.0000 mL | Freq: Once | INTRAVENOUS | Status: AC
  Administered 2012-03-11: 1000 mL via INTRAVENOUS

## 2012-03-11 MED ORDER — LORAZEPAM 2 MG/ML IJ SOLN
2.0000 mg | Freq: Four times a day (QID) | INTRAMUSCULAR | Status: DC | PRN
  Administered 2012-03-11 – 2012-03-12 (×2): 2 mg via INTRAVENOUS
  Filled 2012-03-11 (×3): qty 1

## 2012-03-11 MED ORDER — HYDROMORPHONE HCL PF 2 MG/ML IJ SOLN
4.0000 mg | INTRAMUSCULAR | Status: DC | PRN
  Administered 2012-03-11 – 2012-03-13 (×11): 4 mg via INTRAVENOUS
  Filled 2012-03-11 (×10): qty 2
  Filled 2012-03-11: qty 1
  Filled 2012-03-11: qty 2
  Filled 2012-03-11: qty 1

## 2012-03-11 MED ORDER — POTASSIUM CHLORIDE IN NACL 40-0.9 MEQ/L-% IV SOLN
INTRAVENOUS | Status: DC
  Filled 2012-03-11 (×2): qty 1000

## 2012-03-11 MED ORDER — POTASSIUM CHLORIDE IN NACL 20-0.9 MEQ/L-% IV SOLN
INTRAVENOUS | Status: DC
  Filled 2012-03-11 (×5): qty 1000

## 2012-03-11 MED ORDER — POLYETHYLENE GLYCOL 3350 17 G PO PACK
17.0000 g | PACK | Freq: Every day | ORAL | Status: DC
  Administered 2012-03-11 – 2012-03-13 (×2): 17 g via ORAL
  Filled 2012-03-11 (×3): qty 1

## 2012-03-11 MED ORDER — PREDNISONE 20 MG PO TABS
20.0000 mg | ORAL_TABLET | Freq: Every day | ORAL | Status: DC
  Filled 2012-03-11 (×2): qty 1

## 2012-03-11 MED ORDER — ONDANSETRON HCL 4 MG/2ML IJ SOLN
4.0000 mg | Freq: Four times a day (QID) | INTRAMUSCULAR | Status: DC | PRN
  Administered 2012-03-11 – 2012-03-13 (×3): 4 mg via INTRAVENOUS
  Filled 2012-03-11 (×4): qty 2

## 2012-03-11 MED ORDER — POTASSIUM CHLORIDE IN NACL 40-0.9 MEQ/L-% IV SOLN
INTRAVENOUS | Status: DC
  Administered 2012-03-11: 125 mL/h via INTRAVENOUS
  Administered 2012-03-12: 04:00:00 via INTRAVENOUS
  Administered 2012-03-13: 125 mL/h via INTRAVENOUS
  Filled 2012-03-11 (×9): qty 1000

## 2012-03-11 MED ORDER — ONDANSETRON HCL 4 MG/2ML IJ SOLN
4.0000 mg | Freq: Once | INTRAMUSCULAR | Status: AC
  Administered 2012-03-11: 4 mg via INTRAVENOUS
  Filled 2012-03-11: qty 2

## 2012-03-11 MED ORDER — FAMOTIDINE IN NACL 20-0.9 MG/50ML-% IV SOLN
20.0000 mg | Freq: Two times a day (BID) | INTRAVENOUS | Status: DC
  Administered 2012-03-11: 20 mg via INTRAVENOUS
  Filled 2012-03-11 (×5): qty 50

## 2012-03-11 MED ORDER — HYDROMORPHONE HCL ER 8 MG PO T24A
64.0000 mg | EXTENDED_RELEASE_TABLET | Freq: Every day | ORAL | Status: DC
  Filled 2012-03-11: qty 8
  Filled 2012-03-11: qty 1
  Filled 2012-03-11: qty 8

## 2012-03-11 MED ORDER — TRAZODONE HCL 50 MG PO TABS
50.0000 mg | ORAL_TABLET | Freq: Every day | ORAL | Status: DC
  Administered 2012-03-11: 50 mg via ORAL
  Filled 2012-03-11 (×5): qty 1

## 2012-03-11 MED ORDER — ENOXAPARIN SODIUM 40 MG/0.4ML ~~LOC~~ SOLN
40.0000 mg | SUBCUTANEOUS | Status: DC
  Filled 2012-03-11 (×2): qty 0.4

## 2012-03-11 MED ORDER — DOCUSATE SODIUM 100 MG PO CAPS
100.0000 mg | ORAL_CAPSULE | Freq: Two times a day (BID) | ORAL | Status: DC
  Filled 2012-03-11 (×3): qty 1

## 2012-03-11 MED ORDER — OXYCODONE HCL 5 MG PO TABS: 5.0000 mg | ORAL_TABLET | ORAL | Status: DC | PRN

## 2012-03-11 MED ORDER — ALPRAZOLAM 0.25 MG PO TABS: 0.2500 mg | ORAL_TABLET | Freq: Three times a day (TID) | ORAL | Status: DC | PRN

## 2012-03-11 MED ORDER — PROMETHAZINE HCL 25 MG/ML IJ SOLN
25.0000 mg | Freq: Once | INTRAMUSCULAR | Status: AC
  Administered 2012-03-11: 25 mg via INTRAVENOUS
  Filled 2012-03-11: qty 1

## 2012-03-11 MED ORDER — LEVALBUTEROL HCL 0.63 MG/3ML IN NEBU
0.6300 mg | INHALATION_SOLUTION | Freq: Four times a day (QID) | RESPIRATORY_TRACT | Status: DC
  Filled 2012-03-11 (×7): qty 3
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
## 2012-03-11 NOTE — ED Notes (Signed)
Hold gastric tube until ct and obstruction confirmed.

## 2012-03-11 NOTE — ED Notes (Signed)
Pt refused to have catheter.  Stated that it is one procedure he will not do.

## 2012-03-11 NOTE — ED Notes (Signed)
Report called to 3 east.

## 2012-03-11 NOTE — ED Notes (Signed)
Mother's cell Gavin Pound) [phone]

## 2012-03-11 NOTE — ED Notes (Signed)
Pt next to go to CT.
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
## 2012-03-11 NOTE — Progress Notes (Addendum)
Discussed with patient multiple times ,about need to stay , need for IVF , Risk of death ,from infection,dehydration,   Need for repeat labs in 24 hours  Spoke to Dr Vedia Pereyra, who agrees patient needs to stay in hospital for further evaluation  He will leave AMA if he wants to leave tonight  Discussed with RN,   Will also explain this to the patient again face to face today ,but if still refuses to stay ,then we will consider this a STRICT AMA discharge

## 2012-03-11 NOTE — ED Notes (Signed)
Family at bedside and pt is made aware that a urine sample is needed. Pt stated that he will try after some fluids are in him.
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
## 2012-03-12 DIAGNOSIS — G8929 Other chronic pain: Secondary | ICD-10-CM

## 2012-03-12 DIAGNOSIS — F4310 Post-traumatic stress disorder, unspecified: Secondary | ICD-10-CM

## 2012-03-12 DIAGNOSIS — R112 Nausea with vomiting, unspecified: Principal | ICD-10-CM

## 2012-03-12 DIAGNOSIS — IMO0002 Reserved for concepts with insufficient information to code with codable children: Secondary | ICD-10-CM

## 2012-03-12 LAB — COMPREHENSIVE METABOLIC PANEL
ALT: 64 U/L — ABNORMAL HIGH (ref 0–53)
AST: 42 U/L — ABNORMAL HIGH (ref 0–37)
Alkaline Phosphatase: 74 U/L (ref 39–117)
CO2: 29 mEq/L (ref 19–32)
Calcium: 8.9 mg/dL (ref 8.4–10.5)
Chloride: 105 mEq/L (ref 96–112)
GFR calc Af Amer: >90 mL/min (ref >90)
GFR calc non Af Amer: >90 mL/min (ref >90)
Glucose, Bld: 87 mg/dL (ref 70–99)
Potassium: 3.7 mEq/L (ref 3.5–5.1)
Sodium: 138 mEq/L (ref 135–145)
Total Bilirubin: 0.8 mg/dL (ref 0.3–1.2)

## 2012-03-12 LAB — CBC
Hemoglobin: 11.9 g/dL — ABNORMAL LOW (ref 13.0–17.0)
Platelets: 168 10*3/uL (ref 150–400)
RBC: 4.03 MIL/uL — ABNORMAL LOW (ref 4.22–5.81)
WBC: 6.3 10*3/uL (ref 4.0–10.5)

## 2012-03-12 LAB — INFLUENZA PANEL BY PCR (TYPE A & B): Influenza B By PCR: NEGATIVE

## 2012-03-12 MED ORDER — FAMOTIDINE 20 MG PO TABS
20.0000 mg | ORAL_TABLET | Freq: Two times a day (BID) | ORAL | Status: DC
  Filled 2012-03-12 (×3): qty 1

## 2012-03-12 MED ORDER — FAMOTIDINE 20 MG PO TABS
20.0000 mg | ORAL_TABLET | Freq: Two times a day (BID) | ORAL | Status: DC
  Administered 2012-03-12 – 2012-03-13 (×2): 20 mg via ORAL
  Filled 2012-03-12 (×3): qty 1

## 2012-03-12 MED ORDER — FAMOTIDINE 20 MG PO TABS: 20.0000 mg | ORAL_TABLET | Freq: Two times a day (BID) | ORAL | Status: DC

## 2012-03-12 NOTE — Progress Notes (Signed)
Patient complains of nausea, pain, states that he is feeling like he will not be able to manage his pain and nausea at home at this time, patient was ready for discharge, Dr. Myna Hidalgo called to notify of patients status and fear. Will hold discharge until tomorrow. Patient states he is relieved. Medications given for pain and nausea.

## 2012-03-12 NOTE — Discharge Summary (Signed)
I will dictate later

## 2012-03-12 NOTE — Progress Notes (Signed)
The patient is receiving Famotidine by the intravenous route.  Based on criteria approved by the Pharmacy and Therapeutics Committee and the Medical Executive Committee, the medication is being converted to the equivalent oral dose form.  These criteria include: -No Active GI bleeding -Able to tolerate diet of full liquids (or better) or tube feeding -Able to tolerate other medications by the oral or enteral route  If you have any questions about this conversion, please contact the Pharmacy Department (ext [phone]).  Thank you.  Otho Bellows, PHARMD 03/12/2012 1:25 PM

## 2012-03-12 NOTE — Progress Notes (Signed)
I had [name] already for discharge. He was doing well. He slept all night. He was not having much in the way of nausea or vomiting. His blood work actually came back normal. His BUN and creatinine normalized. Unfortunately, he wanted to stay. He also had nausea and pain. Been issues for him. As such, I will keep him overnight. Will recheck lab work on him in the morning and plan on a discharged tomorrow  I spent over a half hour to his discharge already. Unfortunately had to make sure that the orders that were cancelled were re-instated.  His vital signs are stable. Blood pressure is 121/67. He was afebrile his exam is unremarkable. His abdomen is nontender. He has good bowel sounds. His lungs are clear. Cardiac exam the tachycardic.  He had flu studies done. His influenza was negative. He was negative for H1N1. His BUN and creatinine were 16 and 0.83. His rising count 6.3. Hemoglobin 12.  We will try to let him go home tomorrow morning.  Proverbs 33:4  Andrew Dalton

## 2012-03-13 LAB — BASIC METABOLIC PANEL
Chloride: 105 mEq/L (ref 96–112)
Creatinine, Ser: 0.72 mg/dL (ref 0.50–1.35)
GFR calc Af Amer: >90 mL/min (ref >90)
Potassium: 4.3 mEq/L (ref 3.5–5.1)
Sodium: 137 mEq/L (ref 135–145)

## 2012-03-13 NOTE — Progress Notes (Signed)
Pt D/C home, stable and  Pain reduced, D/C instructions done pt verbalizes understanding.

## 2012-03-13 NOTE — Progress Notes (Signed)
Andrew Dalton did well last night. He slept well. He is not complaining much of way of pain. Requiring his on schedule pain medications.  He is not nauseated. He does seem to be eating and drinking OK. Maybe a little bit of dyspepsia but no emesis. He has not had any diarrhea. Had some diarrhea when he came in but this has resolved.  We did talk about is "lifestyle". I told him that he needs to be very careful with with "enjoyed himself". Realize that he had a change in his attitude with respect to his life. He is still doing with PTSD and this always needs to be kept in mind.u just needs to be where of going to excess with his lifestyle. I also offered him the possibility of talking with a psychiatrist which I am not sure he would favor.  All of his vital signs are stable.  He will let us know if he is ready to go home. Again, his lab work looks good. He is out of bed without problem.  Luke 24:34  Pete E.

## 2012-03-25 ENCOUNTER — Other Ambulatory Visit (HOSPITAL_BASED_OUTPATIENT_CLINIC_OR_DEPARTMENT_OTHER): Payer: Non-veteran care | Admitting: Lab

## 2012-03-25 ENCOUNTER — Ambulatory Visit (HOSPITAL_BASED_OUTPATIENT_CLINIC_OR_DEPARTMENT_OTHER): Payer: Non-veteran care

## 2012-03-25 ENCOUNTER — Other Ambulatory Visit: Payer: Self-pay | Admitting: *Deleted

## 2012-03-25 ENCOUNTER — Ambulatory Visit (HOSPITAL_BASED_OUTPATIENT_CLINIC_OR_DEPARTMENT_OTHER): Payer: Non-veteran care | Admitting: Hematology & Oncology

## 2012-03-25 VITALS — BP 108/66 | HR 71 | Temp 98.4°F | Ht 72.0 in | Wt 152.0 lb

## 2012-03-25 DIAGNOSIS — F909 Attention-deficit hyperactivity disorder, unspecified type: Secondary | ICD-10-CM

## 2012-03-25 DIAGNOSIS — IMO0002 Reserved for concepts with insufficient information to code with codable children: Secondary | ICD-10-CM

## 2012-03-25 DIAGNOSIS — Z5111 Encounter for antineoplastic chemotherapy: Secondary | ICD-10-CM

## 2012-03-25 LAB — CBC WITH DIFFERENTIAL (CANCER CENTER ONLY)
BASO%: 0.9 % (ref 0.0–2.0)
HCT: 37 % — ABNORMAL LOW (ref 38.7–49.9)
LYMPH#: 0.9 10*3/uL (ref 0.9–3.3)
MONO#: 0.6 10*3/uL (ref 0.1–0.9)
NEUT%: 31.7 % — ABNORMAL LOW (ref 40.0–80.0)
RBC: 4.37 10*6/uL (ref 4.20–5.70)
RDW: 14.4 % (ref 11.1–15.7)
WBC: 2.1 10*3/uL — ABNORMAL LOW (ref 4.0–10.0)

## 2012-03-25 LAB — COMPREHENSIVE METABOLIC PANEL
ALT: 24 U/L (ref 0–53)
CO2: 28 mEq/L (ref 19–32)
Calcium: 9.5 mg/dL (ref 8.4–10.5)
Chloride: 107 mEq/L (ref 96–112)
Creatinine, Ser: 0.78 mg/dL (ref 0.50–1.35)
Sodium: 141 mEq/L (ref 135–145)
Total Protein: 6.3 g/dL (ref 6.0–8.3)

## 2012-03-25 LAB — LACTATE DEHYDROGENASE: LDH: 117 U/L (ref 94–250)

## 2012-03-25 MED ORDER — PALONOSETRON HCL INJECTION 0.25 MG/5ML: 0.2500 mg | Freq: Once | INTRAVENOUS | Status: DC

## 2012-03-25 MED ORDER — VINCRISTINE SULFATE CHEMO INJECTION 1 MG/ML
2.0000 mg | Freq: Once | INTRAVENOUS | Status: AC
  Administered 2012-03-25: 2 mg via INTRAVENOUS
  Filled 2012-03-25: qty 2

## 2012-03-25 MED ORDER — SODIUM CHLORIDE 0.9 % IV SOLN
150.0000 mg | Freq: Once | INTRAVENOUS | Status: AC
  Administered 2012-03-25: 150 mg via INTRAVENOUS
  Filled 2012-03-25: qty 5

## 2012-03-25 MED ORDER — SODIUM CHLORIDE 0.9 % IJ SOLN
10.0000 mL | INTRAMUSCULAR | Status: DC | PRN
  Filled 2012-03-25: qty 10

## 2012-03-25 MED ORDER — SODIUM CHLORIDE 0.9 % IV SOLN
750.0000 mg/m2 | Freq: Once | INTRAVENOUS | Status: AC
  Administered 2012-03-25: 1460 mg via INTRAVENOUS
  Filled 2012-03-25: qty 73

## 2012-03-25 MED ORDER — DEXAMETHASONE SODIUM PHOSPHATE 4 MG/ML IJ SOLN
12.0000 mg | Freq: Once | INTRAMUSCULAR | Status: AC
  Administered 2012-03-25: 13:00:00 via INTRAVENOUS

## 2012-03-25 MED ORDER — LEVOFLOXACIN 500 MG PO TABS: 500.0000 mg | ORAL_TABLET | Freq: Every day | ORAL | Status: AC

## 2012-03-25 MED ORDER — DOXORUBICIN HCL CHEMO IV INJECTION 2 MG/ML
50.0000 mg/m2 | Freq: Once | INTRAVENOUS | Status: AC
  Administered 2012-03-25: 98 mg via INTRAVENOUS
  Filled 2012-03-25: qty 49

## 2012-03-25 MED ORDER — AMPHETAMINE-DEXTROAMPHETAMINE 20 MG PO TABS: 20.0000 mg | ORAL_TABLET | Freq: Every day | ORAL | Status: DC

## 2012-03-25 MED ORDER — HEPARIN SOD (PORK) LOCK FLUSH 100 UNIT/ML IV SOLN
500.0000 [IU] | Freq: Once | INTRAVENOUS | Status: DC | PRN
  Filled 2012-03-25: qty 5

## 2012-03-25 MED ORDER — SODIUM CHLORIDE 0.9 % IV SOLN: Freq: Once | INTRAVENOUS | Status: DC

## 2012-03-25 NOTE — Progress Notes (Signed)
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  CURRENT THERAPY:  Patient is status post 2 cycles of CHOP.  INTERVAL HISTORY:  Mr. Cafaro comes in for followup.  He unfortunately was hospitalized a couple of weeks ago.  He had nausea, vomiting and some diarrhea.  This may have been the result of a little bit to much "excess" over the recent weekend.  He got IV fluids.  All of his lab work looked good otherwise.  He had no obvious infection.  He did have some hypokalemia.  He had no fever, sweats or chills.  Pain-wise is doing real well.  He has had a good appetite.  He has had no cough.  He has really improved with respect to his pain.  PHYSICAL EXAMINATION:  This is a thin, but fairly well-nourished, white gentleman in no obvious distress.  Vital signs:  98.4, pulse 71, respiratory rate 18, blood pressure 108/66.  Weight is 152.  Head and neck exam shows a normocephalic, atraumatic skull.  There are no ocular or oral lesions.  There are no palpable cervical or supraclavicular lymph nodes.  There may be some slight fullness over in the right neck but no distinct lymphadenopathy is noted.  There are no supraclavicular lymph nodes.  Lungs:  Clear bilaterally.  Cardiac:  Regular rate and rhythm with a normal S1, S2.  There are no murmurs, rubs or bruits. Axillary exam shows no bilateral axillary adenopathy.  Extremities:  No clubbing, cyanosis or edema.  He has no fluid wave.  There is no palpable hepatosplenomegaly.  Extremities: No clubbing, cyanosis or edema.  Neurologic:  No focal neurological deficits.  Skin:  No rashes, ecchymosis or petechia.  LABORATORY STUDIES:  White cell count 2.1, hemoglobin 13.3, hematocrit 37, platelet count 253.  ANC is 700.  IMPRESSION:  Mr. Tozzi is a 21 year old gentleman with anaplastic large cell lymphoma.  Clinically, he has responded very nicely.  Will go ahead for his third  cycle of chemo.  I realize that his neutrophil count is down quite a bit.  However, monocytes are on the way up.  As such, I believe that we should be okay to treat him.  I want to try to avoid Neulasta.  I will give him some Levaquin as a prophylactic measure.  I will go ahead and plan for his 4th cycle in 3 weeks.  After the 4th cycle, I do want to do a PET scan on him, which we will have to do through the [place].    ______________________________ Josph Macho, M.D. PRE/MEDQ  D:  03/25/2012  T:  03/25/2012  Job:  [id number]

## 2012-03-25 NOTE — Patient Instructions (Signed)
[facility] Discharge Instructions for Patients Receiving Chemotherapy  Today you received the following chemotherapy agents Adriamycin, Vincristine, Cytoxan. To take Prednisone at home starting the day of chemotherapy and continue for 5 days.   To help prevent nausea and vomiting after your treatment, we encourage you to take your nausea medication:  Zofran 8mg every 12 hours starting 2 days after chemo for 2 days then every 12 hours as needed for nausea or vomiting  Phenergan 12.5 mg or (1/2 of a 25 mg tab) every 6 hours as needed for nausea. Can take immediately after chemo.  EMLA Cream Apply quarter size application to Port site 1 hour prior to chemotherapy. Do NOT rub in. Cover area with plastic wrap.   If you develop nausea and vomiting that is not controlled by your nausea medication, call the clinic. If it is after clinic hours your family physician or the after hours number for the clinic or go to the Emergency Department.   BELOW ARE SYMPTOMS THAT SHOULD BE REPORTED IMMEDIATELY:  *FEVER GREATER THAN 100.5 F  *CHILLS WITH OR WITHOUT FEVER  NAUSEA AND VOMITING THAT IS NOT CONTROLLED WITH YOUR NAUSEA MEDICATION  *UNUSUAL SHORTNESS OF BREATH  *UNUSUAL BRUISING OR BLEEDING  TENDERNESS IN MOUTH AND THROAT WITH OR WITHOUT PRESENCE OF ULCERS  *URINARY PROBLEMS  *BOWEL PROBLEMS  UNUSUAL RASH Items with * indicate a potential emergency and should be followed up as soon as possible.  One of the nurses will contact you 24 hours after your treatment. Please let the nurse know about any problems that you may have experienced. Feel free to call the clinic you have any questions or concerns. The clinic phone number is [phone].   I have been informed and understand all the instructions given to me. I know to contact the clinic, my physician, or go to the Emergency Department if any problems should occur. I do not have any questions at this time, but understand  that I may call the clinic during office hours   should I have any questions or need assistance in obtaining follow up care.    __________________________________________          _____________     __________ Signature of Patient                                                                     Date                   Time    __________________________________________ Nurse's Signature    

## 2012-03-25 NOTE — Progress Notes (Signed)
This office note has been dictated.

## 2012-03-31 NOTE — Discharge Summary (Signed)
#   [id number] is d/c summary

## 2012-04-01 ENCOUNTER — Encounter (HOSPITAL_COMMUNITY): Payer: Self-pay | Admitting: *Deleted

## 2012-04-01 ENCOUNTER — Ambulatory Visit (HOSPITAL_BASED_OUTPATIENT_CLINIC_OR_DEPARTMENT_OTHER): Payer: Non-veteran care

## 2012-04-01 ENCOUNTER — Other Ambulatory Visit: Payer: Self-pay | Admitting: *Deleted

## 2012-04-01 ENCOUNTER — Inpatient Hospital Stay (HOSPITAL_COMMUNITY)
Admission: AD | Admit: 2012-04-01 | Discharge: 2012-04-07 | DRG: 392 | Disposition: A | Discharge status: Home / Self Care | Payer: Non-veteran care | Source: Ambulatory Visit | Attending: Hematology & Oncology | Admitting: Hematology & Oncology

## 2012-04-01 ENCOUNTER — Ambulatory Visit (EMERGENCY_DEPARTMENT_HOSPITAL)
Admission: RE | Admit: 2012-04-01 | Discharge: 2012-04-01 | Disposition: A | Discharge status: Home / Self Care | Payer: Non-veteran care | Source: Ambulatory Visit | Attending: Hematology & Oncology | Admitting: Hematology & Oncology

## 2012-04-01 ENCOUNTER — Other Ambulatory Visit: Payer: Self-pay | Admitting: Hematology & Oncology

## 2012-04-01 VITALS — BP 140/72 | HR 82 | Temp 96.6°F | Wt 144.0 lb

## 2012-04-01 DIAGNOSIS — R112 Nausea with vomiting, unspecified: Secondary | ICD-10-CM

## 2012-04-01 DIAGNOSIS — C8590 Non-Hodgkin lymphoma, unspecified, unspecified site: Secondary | ICD-10-CM

## 2012-04-01 DIAGNOSIS — R109 Unspecified abdominal pain: Secondary | ICD-10-CM

## 2012-04-01 DIAGNOSIS — K5900 Constipation, unspecified: Principal | ICD-10-CM | POA: Diagnosis present

## 2012-04-01 DIAGNOSIS — D696 Thrombocytopenia, unspecified: Secondary | ICD-10-CM | POA: Diagnosis present

## 2012-04-01 DIAGNOSIS — IMO0002 Reserved for concepts with insufficient information to code with codable children: Secondary | ICD-10-CM

## 2012-04-01 DIAGNOSIS — F4310 Post-traumatic stress disorder, unspecified: Secondary | ICD-10-CM | POA: Diagnosis present

## 2012-04-01 DIAGNOSIS — T451X5A Adverse effect of antineoplastic and immunosuppressive drugs, initial encounter: Secondary | ICD-10-CM | POA: Diagnosis present

## 2012-04-01 DIAGNOSIS — D709 Neutropenia, unspecified: Secondary | ICD-10-CM | POA: Diagnosis present

## 2012-04-01 DIAGNOSIS — Z9221 Personal history of antineoplastic chemotherapy: Secondary | ICD-10-CM

## 2012-04-01 LAB — CBC
HCT: 31.3 % — ABNORMAL LOW (ref 39.0–52.0)
MCH: 29.6 pg (ref 26.0–34.0)
MCV: 81.9 fL (ref 78.0–100.0)
Platelets: 176 10*3/uL (ref 150–400)
RDW: 13.4 % (ref 11.5–15.5)

## 2012-04-01 LAB — COMPREHENSIVE METABOLIC PANEL
AST: 30 U/L (ref 0–37)
Albumin: 3.9 g/dL (ref 3.5–5.2)
BUN: 8 mg/dL (ref 6–23)
CO2: 27 mEq/L (ref 19–32)
Calcium: 8.8 mg/dL (ref 8.4–10.5)
Creatinine, Ser: 0.62 mg/dL (ref 0.50–1.35)
GFR calc non Af Amer: >90 mL/min (ref >90)

## 2012-04-01 LAB — DIFFERENTIAL
Basophils Absolute: 0 10*3/uL (ref 0.0–0.1)
Lymphocytes Relative: 10 % — ABNORMAL LOW (ref 12–46)
Monocytes Absolute: 0 10*3/uL — ABNORMAL LOW (ref 0.1–1.0)
Neutro Abs: 3 10*3/uL (ref 1.7–7.7)
Neutrophils Relative %: 88 % — ABNORMAL HIGH (ref 43–77)

## 2012-04-01 MED ORDER — LORAZEPAM 2 MG/ML IJ SOLN
2.0000 mg | INTRAMUSCULAR | Status: DC | PRN
  Administered 2012-04-01: 1 mg via INTRAVENOUS
  Administered 2012-04-02: 2 mg via INTRAVENOUS
  Administered 2012-04-02: 1 mg via INTRAVENOUS
  Administered 2012-04-02 – 2012-04-03 (×3): 2 mg via INTRAVENOUS
  Filled 2012-04-01 (×6): qty 1

## 2012-04-01 MED ORDER — HYDROMORPHONE HCL PF 4 MG/ML IJ SOLN
8.0000 mg | Freq: Once | INTRAMUSCULAR | Status: AC
  Administered 2012-04-01: 8 mg via INTRAVENOUS

## 2012-04-01 MED ORDER — SODIUM CHLORIDE 0.9 % IV SOLN
INTRAVENOUS | Status: DC
  Administered 2012-04-01 (×3): via INTRAVENOUS

## 2012-04-01 MED ORDER — LORAZEPAM 2 MG/ML IJ SOLN
2.0000 mg | Freq: Once | INTRAMUSCULAR | Status: AC
  Administered 2012-04-01: 2 mg via INTRAVENOUS

## 2012-04-01 MED ORDER — TRAZODONE HCL 50 MG PO TABS
50.0000 mg | ORAL_TABLET | Freq: Every day | ORAL | Status: DC
  Administered 2012-04-01 – 2012-04-06 (×6): 50 mg via ORAL
  Filled 2012-04-01 (×7): qty 1

## 2012-04-01 MED ORDER — PROMETHAZINE HCL 25 MG/ML IJ SOLN
25.0000 mg | Freq: Four times a day (QID) | INTRAMUSCULAR | Status: DC | PRN
  Administered 2012-04-02 – 2012-04-03 (×3): 25 mg via INTRAVENOUS
  Filled 2012-04-01 (×4): qty 1

## 2012-04-01 MED ORDER — FAMOTIDINE 40 MG PO TABS
40.0000 mg | ORAL_TABLET | Freq: Every day | ORAL | Status: DC
  Administered 2012-04-01 – 2012-04-06 (×5): 40 mg via ORAL
  Filled 2012-04-01 (×11): qty 1

## 2012-04-01 MED ORDER — ENOXAPARIN SODIUM 40 MG/0.4ML ~~LOC~~ SOLN
40.0000 mg | SUBCUTANEOUS | Status: DC
  Filled 2012-04-01 (×7): qty 0.4

## 2012-04-01 MED ORDER — POTASSIUM CHLORIDE IN NACL 20-0.9 MEQ/L-% IV SOLN
INTRAVENOUS | Status: DC
  Administered 2012-04-01 – 2012-04-02 (×2): 1000 mL via INTRAVENOUS
  Administered 2012-04-03 – 2012-04-06 (×5): via INTRAVENOUS
  Filled 2012-04-01 (×11): qty 1000

## 2012-04-01 MED ORDER — HYDROMORPHONE HCL PF 4 MG/ML IJ SOLN
8.0000 mg | INTRAMUSCULAR | Status: DC | PRN
  Administered 2012-04-01 – 2012-04-03 (×12): 8 mg via INTRAVENOUS
  Filled 2012-04-01 (×13): qty 2

## 2012-04-01 MED ORDER — HYDROMORPHONE HCL PF 4 MG/ML IJ SOLN
8.0000 mg | INTRAMUSCULAR | Status: DC | PRN
  Administered 2012-04-01: 8 mg via INTRAVENOUS
  Filled 2012-04-01: qty 2
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
## 2012-04-01 NOTE — Patient Instructions (Signed)

## 2012-04-01 NOTE — H&P (Signed)
#   [id number] is the admit note for CSN [id number].

## 2012-04-02 DIAGNOSIS — C8589 Other specified types of non-Hodgkin lymphoma, extranodal and solid organ sites: Secondary | ICD-10-CM

## 2012-04-02 MED ORDER — HYDROMORPHONE HCL ER 8 MG PO T24A
64.0000 mg | EXTENDED_RELEASE_TABLET | ORAL | Status: DC
  Filled 2012-04-02 (×2): qty 1
  Filled 2012-04-02: qty 8
  Filled 2012-04-02: qty 6

## 2012-04-02 NOTE — Progress Notes (Signed)
Pt refused to take daily dose of  Hydromorphone HCl (Exalgo) 64mg .  Stated this dose is not helping his pain and would rather stay on the IV pain regimen.  Discussed with him and he agreed to take since there will be no change to PRN meds then he saw there would be 8 pills to equal 64mg  and he refused to take so many pills.  Beryl Meager

## 2012-04-02 NOTE — Progress Notes (Signed)
Patient reports that he "just disimpacted himself" and refuses to do enema at present. Will monitor.

## 2012-04-02 NOTE — H&P (Signed)
Andrew Dalton, Andrew Dalton                 ACCOUNT NO.:  [id number]  MEDICAL RECORD NO.:  [id number]  LOCATION:                               FACILITY:  [facility]  PHYSICIAN:  Josph Macho, M.D.  DATE OF BIRTH:  25-Nov-1991  DATE OF ADMISSION: DATE OF DISCHARGE:                             HISTORY & PHYSICAL   REASON FOR ADMISSION: 1. Nausea and vomiting. 2. Possible impaction secondary to constipation. 3. Anaplastic large cell lymphoma.  HISTORY OF PRESENT ILLNESS:  Andrew Dalton is a 21 year old gentleman with anaplastic large cell non-Hodgkin's lymphoma.  He has had only 3 cycles of chemotherapy.  He has gotten CHOP chemotherapy.  His last cycle was on the 16th.  He now has nausea and vomiting.  He says he is constipated.  He takes quite a bit of pain medication secondary to posttraumatic stress disorder and injuries during [place].  He manually tried to disimpact himself.  We told him to come to the office.  Marland Kitchen  He is on "edge" because of his posttraumatic stress disorder.  He did have abdominal x-ray.  This did show no bowel obstruction.  He did have a ball of stool in the rectum.  Because of his overall status, I felt that he may needed to be admitted for couple days, so we can try to work on the issues with his abdominal discomfort.  He is afebrile.  He has had no bleeding.  PAST MEDICAL HISTORY:  Posttraumatic stress disorder.  ALLERGIES:  PENICILLIN AND BACTRIM.  MEDICATIONS: 1. Exalgo 64 mg p.o. daily. 2. Dilaudid 16 mg q.4 hours p.r.n. 3. Pepcid 20 mg p.o. b.i.d. 4. Adderall 20 mg p.o. daily. 5. Xanax 1 mg p.o. q.4 hours p.r.n. 6. Trazodone 50 mg p.o. at bedtime. 7. Ambien 12.5 mg p.o. at bedtime. 8. Phenergan 12.5 mg p.o. q.6 hours p.r.n.  SOCIAL HISTORY:  Remarkable for tobacco use. I do not think he has any alcohol use.  PHYSICAL EXAMINATION:  GENERAL:  This is a fairly well-developed, well- nourished, white gentleman, in no obvious distress. VITAL SIGNS:   Temperature 96.6, pulse 82, respiratory rate 18, blood pressure 140/72, weight is 144. HEAD AND NECK:  Exam shows a normocephalic and atraumatic skull.  There are no ocular or oral lesions.  He has no mucositis.  There is no adenopathy in his neck. LUNGS:  Clear bilaterally. CARDIAC EXAMINATION:  Regular rate and rhythm with a normal S1 and S2. No murmurs, rubs, or bruits. ABDOMEN:  Soft, good bowel sounds.  There is no palpable abdominal mass. There is no fluid wave.  There is no palpable hepatosplenomegaly. EXTREMITIES:  Shows no clubbing, cyanosis, or edema. SKIN EXAM:  No rashes, ecchymosis, or petechia. NEUROLOGIC EXAM:  No focal neurological deficits.  LABORATORY STUDIES:  Pending.  IMPRESSION:  Andrew Dalton is a 21 year old gentleman.  We will admit him. He has Port-A-Cath in.  He will get IV fluids.  He needs an enema.  I do not think he is neutropenic.  We will have to watch out for this.  Again, he is very "fragile" mentally.  He does tend to have outbursts because of the posttraumatic stress disorder.  He  does not need a lot of interaction.  This would only cause more frustration for him.  We will try to relieve this rectal impaction.  Hopefully, this will get his bowels working better.  We will make sure he has his medications to help with his posttraumatic stress disorder.  Coping for him is difficult on occasion.  Again, we will check his lab work.  I will make sure that his electrolytes are not out of line.  I suspect that he likely will be in the hospital for 2 or 3 days.  He has some situation that might not be the best in the world.  Some times she has come into the hospital just to better deal with what's going on in his personal life.     Josph Macho, M.D.     PRE/MEDQ  D:  04/01/2012  T:  04/02/2012  Job:  [id number]

## 2012-04-02 NOTE — Progress Notes (Addendum)
Patient c/o pain in l leg/abdomen. Last pain med at 1900. Ordered for q 4 hours. Paged Dr Donnie Coffin with orders received to change dilaudid to q 2-4 hours prn .

## 2012-04-02 NOTE — Progress Notes (Signed)
Mr. Andrew Dalton had a good night. He did not have vomiting he said that he manually disimpacted himself. He did not have any diarrhea . There has been no fever. He really could not eat much yesterday. He says he does feel a little more hungry today. Is out of bed. His pain seems to be adequate. He is not having any of his post traumatic stress disorder. The admission lab work looked okay. He is not neutropenic. His potassium was okay.  His physical exam shows him to be a febrile. Abdominal exam shows his abdomen to be soft. His bowel sounds are okay. It is no obvious upper abdominal mass. His lungs are clear. Cardiac exam and rhythm.  We will continue his IV fluids today. Will continue his pain regimen. Hopefully, we will be able to discharge him in one or 2 days.  Psalm 139:3.  Pete E.

## 2012-04-02 NOTE — H&P (Deleted)
NAME:  Andrew Dalton, Andrew Dalton                 ACCOUNT NO.:  [id number]  MEDICAL RECORD NO.:  [id number]  LOCATION:                               FACILITY:  [facility]  PHYSICIAN:  Shyam Dawson R Efe Fazzino, M.D.  DATE OF BIRTH:  02/24/1991  DATE OF ADMISSION: DATE OF DISCHARGE:                             HISTORY & PHYSICAL   REASON FOR ADMISSION: 1. Nausea and vomiting. 2. Possible impaction secondary to constipation. 3. Anaplastic large cell lymphoma.  HISTORY OF PRESENT ILLNESS:  Andrew Dalton is a 21-year-old gentleman with anaplastic large cell non-Hodgkin's lymphoma.  He has had only 3 cycles of chemotherapy.  He has gotten CHOP chemotherapy.  His last cycle was on the 16th.  He now has nausea and vomiting.  He says he is constipated.  He takes quite a bit of pain medication secondary to posttraumatic stress disorder and injuries during [place].  He manually tried to disimpact himself.  We told him to come to the office.  .  He is on "edge" because of his posttraumatic stress disorder.  He did have abdominal x-ray.  This did show no bowel obstruction.  He did have a ball of stool in the rectum.  Because of his overall status, I felt that he may needed to be admitted for couple days, so we can try to work on the issues with his abdominal discomfort.  He is afebrile.  He has had no bleeding.  PAST MEDICAL HISTORY:  Posttraumatic stress disorder.  ALLERGIES:  PENICILLIN AND BACTRIM.  MEDICATIONS: 1. Exalgo 64 mg p.o. daily. 2. Dilaudid 16 mg q.4 hours p.r.n. 3. Pepcid 20 mg p.o. b.i.d. 4. Adderall 20 mg p.o. daily. 5. Xanax 1 mg p.o. q.4 hours p.r.n. 6. Trazodone 50 mg p.o. at bedtime. 7. Ambien 12.5 mg p.o. at bedtime. 8. Phenergan 12.5 mg p.o. q.6 hours p.r.n.  SOCIAL HISTORY:  Remarkable for tobacco use. I do not think he has any alcohol use.  PHYSICAL EXAMINATION:  GENERAL:  This is a fairly well-developed, well- nourished, white gentleman, in no obvious distress. VITAL SIGNS:   Temperature 96.6, pulse 82, respiratory rate 18, blood pressure 140/72, weight is 144. HEAD AND NECK:  Exam shows a normocephalic and atraumatic skull.  There are no ocular or oral lesions.  He has no mucositis.  There is no adenopathy in his neck. LUNGS:  Clear bilaterally. CARDIAC EXAMINATION:  Regular rate and rhythm with a normal S1 and S2. No murmurs, rubs, or bruits. ABDOMEN:  Soft, good bowel sounds.  There is no palpable abdominal mass. There is no fluid wave.  There is no palpable hepatosplenomegaly. EXTREMITIES:  Shows no clubbing, cyanosis, or edema. SKIN EXAM:  No rashes, ecchymosis, or petechia. NEUROLOGIC EXAM:  No focal neurological deficits.  LABORATORY STUDIES:  Pending.  IMPRESSION:  Andrew Dalton is a 21-year-old gentleman.  We will admit him. He has Port-A-Cath in.  He will get IV fluids.  He needs an enema.  I do not think he is neutropenic.  We will have to watch out for this.  Again, he is very "fragile" mentally.  He does tend to have outbursts because of the posttraumatic stress disorder.  He   does not need a lot of interaction.  This would only cause more frustration for him.  We will try to relieve this rectal impaction.  Hopefully, this will get his bowels working better.  We will make sure he has his medications to help with his posttraumatic stress disorder.  Coping for him is difficult on occasion.  Again, we will check his lab work.  I will make sure that his electrolytes are not out of line.  I suspect that he likely will be in the hospital for 2 or 3 days.  He has some situation that might not be the best in the world.  Some times she has come into the hospital just to better deal with what's going on in his personal life.     Kalia Vahey R Elliotte Marsalis, M.D.     PRE/MEDQ  D:  04/01/2012  T:  04/02/2012  Job:  [id number] 

## 2012-04-02 NOTE — Progress Notes (Signed)
Alert and responsive, oriented x4. Pt expressed feelings of pain in abdomen along w/ fatigue and nausea. Pain scale 0/10 level #4. IV meds given 8mg  Dilaudid @ 11:30a and 2mg  Ativan @ 11a. IV infusing 0.9 NS w/ KCl to left PAC, adequate blood return prior to NS flush. Pt expressed therapeutic effect of meds was successful and was left to rest. Pt. mentioned earlier today of chest pain around left PAC area, rated @ 4 on 0-10. Dr. Ross Marcus notified, no new orders received. Endorsement given to Nurse [facility] for follow up. Joshlynn Alfonzo/[employer] Student Nurse./Arlinda williams.RN.

## 2012-04-03 ENCOUNTER — Other Ambulatory Visit: Payer: Self-pay | Admitting: *Deleted

## 2012-04-03 ENCOUNTER — Inpatient Hospital Stay (HOSPITAL_COMMUNITY): Payer: Non-veteran care

## 2012-04-03 ENCOUNTER — Other Ambulatory Visit: Payer: Self-pay | Admitting: Oncology

## 2012-04-03 DIAGNOSIS — S069X9A Unspecified intracranial injury with loss of consciousness of unspecified duration, initial encounter: Secondary | ICD-10-CM

## 2012-04-03 DIAGNOSIS — IMO0002 Reserved for concepts with insufficient information to code with codable children: Secondary | ICD-10-CM

## 2012-04-03 DIAGNOSIS — F4310 Post-traumatic stress disorder, unspecified: Secondary | ICD-10-CM

## 2012-04-03 LAB — BASIC METABOLIC PANEL
BUN: 5 mg/dL — ABNORMAL LOW (ref 6–23)
CO2: 27 mEq/L (ref 19–32)
Chloride: 100 mEq/L (ref 96–112)
Glucose, Bld: 94 mg/dL (ref 70–99)
Potassium: 3.7 mEq/L (ref 3.5–5.1)
Sodium: 134 mEq/L — ABNORMAL LOW (ref 135–145)

## 2012-04-03 LAB — CBC
HCT: 28.2 % — ABNORMAL LOW (ref 39.0–52.0)
Hemoglobin: 10.2 g/dL — ABNORMAL LOW (ref 13.0–17.0)
MCHC: 36.2 g/dL — ABNORMAL HIGH (ref 30.0–36.0)
RBC: 3.46 MIL/uL — ABNORMAL LOW (ref 4.22–5.81)
WBC: 0.8 10*3/uL — CL (ref 4.0–10.5)

## 2012-04-03 MED ORDER — HYDROMORPHONE HCL PF 4 MG/ML IJ SOLN
6.0000 mg | INTRAMUSCULAR | Status: DC | PRN
  Administered 2012-04-03 (×2): 6 mg via INTRAVENOUS
  Administered 2012-04-03: 4 mg via INTRAVENOUS
  Administered 2012-04-03 – 2012-04-04 (×3): 6 mg via INTRAVENOUS
  Filled 2012-04-03: qty 2
  Filled 2012-04-03: qty 1
  Filled 2012-04-03 (×4): qty 2
  Filled 2012-04-03: qty 1
  Filled 2012-04-03: qty 2

## 2012-04-03 MED ORDER — HYDROMORPHONE HCL PF 4 MG/ML IJ SOLN
8.0000 mg | Freq: Once | INTRAMUSCULAR | Status: AC
  Administered 2012-04-03: 8 mg via INTRAVENOUS

## 2012-04-03 MED ORDER — MINERAL OIL RE ENEM
1.0000 | ENEMA | Freq: Once | RECTAL | Status: AC
  Administered 2012-04-03: 1 via RECTAL
  Filled 2012-04-03 (×2): qty 1

## 2012-04-03 MED ORDER — LORAZEPAM 2 MG/ML IJ SOLN: 1.0000 mg | INTRAMUSCULAR | Status: DC | PRN

## 2012-04-03 MED ORDER — LORAZEPAM 2 MG/ML IJ SOLN
1.0000 mg | INTRAMUSCULAR | Status: DC | PRN
  Administered 2012-04-03 – 2012-04-04 (×3): 1 mg via INTRAVENOUS
  Filled 2012-04-03 (×4): qty 1

## 2012-04-03 MED ORDER — HEPARIN SOD (PORK) LOCK FLUSH 100 UNIT/ML IV SOLN
INTRAVENOUS | Status: AC
  Filled 2012-04-03: qty 5

## 2012-04-03 NOTE — Progress Notes (Signed)
He had 8 mg iv dilaudid and then was administered a mineral oil enema without results. Pt is to x-ray and then for call results to MD to see if re-treatment is indicated. Merlyn Albert Charity fundraiser

## 2012-04-03 NOTE — Progress Notes (Signed)
Pt D/C to home. Pt is stable, pain improved no new complains. D/C instruction and medication instructions done. Pt verbalize understanding.

## 2012-04-03 NOTE — Progress Notes (Signed)
The patient called me three minutes after 5 PM to complain that Dr Myna Hidalgo had lowered his pain medication and benzodiazepines. He wanted them increased. I explained to the patient that Dr. Myna Hidalgo is deliberately lowering his pain medications and that I do not want to contravene his orders. I also told him I would be glad to place a psychiatric consult. The patient became irate and hung up.  I would suggest this patient's narcotics be changed to methadone and that he be referred to a pain clinic for further management.
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
## 2012-04-03 NOTE — Progress Notes (Signed)
Andrew Dalton unfortunately is having some issues.  It is hard to say how much of this is really from his treatments or how much this is from his PTSD.  He certainly is quite lethargic today.  He certainly possibly may be over-medicated.  There have been some issues with respect to pain medications.  He has typically done well with this.  I am not sure as to why things have changed for him.  He definitely is not ready to go.  His white cell count, however, is down.  It would be nice to try to get him out of the hospital during this period of neutropenia.  I really cannot tell if he has had a bowel movement.  He has had no obvious nausea or vomiting.  It is hard to say how much he is eating.  I think we are going to have to see about getting Psychiatry involved. Andrew Dalton has had a lot of issues with respect to PTSD.  He has been seen by the [place] [facility].  He really has not had success with this.  I just feel bad for him.  He has responded very nicely to the chemo.  I am sure that if we do a PET scan on him, we would not find any active lymphoma.  PHYSICAL EXAMINATION:  Vital Signs:  All of his vital signs look good. His blood pressure is 138/77.  Temperature is 98.9.  Pulse is 108.  Head and Neck Exam:  Shows no oral lesions.  He has no mucositis.  There is no adenopathy in his neck.  There may be some slight tenderness over on the right side.  Lungs:  Clear bilaterally.  Cardiac Exam:  Tachycardic, but regular.  There are no murmurs, rubs, or bruits.  Abdominal Exam: Soft.  He has no fluid wave.  There is no guarding or rebound tenderness.  He has no palpable hepatosplenomegaly.  Extremities:  Show no clubbing, cyanosis, or edema.  Skin Exam:  No rashes.  Neurological Exam:  Shows no focal neurological deficits.  LABORATORY STUDIES:  White cell count 0.8, hemoglobin 10.2, hematocrit 28.2, platelet count 98,000.  Sodium 134, potassium 3.7, BUN 5, creatinine 0.6.  Again, we are going  to have to keep Andrew Dalton for right now.  We are dealing with issues that are, I think more than just his chemotherapy and his lymphoma.  His mom certainly is aware of this.  [facility]  Hopefully, will be able to get him home tomorrow.  Again, he is going to need some serious, I think counseling.  I am just surprised that he is decompensating the way he is.  He is responding to treatment very nicely.  I spent about a 1/2 hour with him this morning, trying to get him to talk to me about what is going on.  I really did not have a lot of success.  I will get another abdominal film on him.  I want to see how things look.  I would like to also get a PET scan on him, but I am not sure we can do this as an inpatient.  I will try, however.    ______________________________ Josph Macho, M.D. PRE/MEDQ  D:  04/03/2012  T:  04/03/2012  Job:  [id number]

## 2012-04-04 ENCOUNTER — Inpatient Hospital Stay (HOSPITAL_COMMUNITY): Payer: Non-veteran care

## 2012-04-04 ENCOUNTER — Encounter (HOSPITAL_COMMUNITY): Payer: Self-pay | Admitting: Radiology

## 2012-04-04 LAB — CBC
HCT: 27.6 % — ABNORMAL LOW (ref 39.0–52.0)
MCV: 81.4 fL (ref 78.0–100.0)
Platelets: 101 10*3/uL — ABNORMAL LOW (ref 150–400)
RBC: 3.39 MIL/uL — ABNORMAL LOW (ref 4.22–5.81)
WBC: 0.7 10*3/uL — CL (ref 4.0–10.5)

## 2012-04-04 LAB — GLUCOSE, CAPILLARY: Glucose-Capillary: 96 mg/dL (ref 70–99)

## 2012-04-04 LAB — BASIC METABOLIC PANEL
CO2: 27 mEq/L (ref 19–32)
Chloride: 103 mEq/L (ref 96–112)
Creatinine, Ser: 0.58 mg/dL (ref 0.50–1.35)

## 2012-04-04 MED ORDER — FLUDEOXYGLUCOSE F - 18 (FDG) INJECTION
14.4000 | Freq: Once | INTRAVENOUS | Status: AC | PRN
  Administered 2012-04-04: 14.4 via INTRAVENOUS

## 2012-04-04 MED ORDER — PROMETHAZINE HCL 25 MG/ML IJ SOLN
25.0000 mg | INTRAMUSCULAR | Status: DC | PRN
  Administered 2012-04-05 – 2012-04-07 (×9): 25 mg via INTRAVENOUS
  Filled 2012-04-04 (×10): qty 1

## 2012-04-04 MED ORDER — HYDROMORPHONE HCL PF 4 MG/ML IJ SOLN
8.0000 mg | INTRAMUSCULAR | Status: DC | PRN
  Administered 2012-04-04 (×7): 8 mg via INTRAVENOUS
  Administered 2012-04-04: 2 mg via INTRAVENOUS
  Administered 2012-04-05 – 2012-04-07 (×20): 8 mg via INTRAVENOUS
  Filled 2012-04-04 (×2): qty 2
  Filled 2012-04-04: qty 1
  Filled 2012-04-04 (×2): qty 2
  Filled 2012-04-04: qty 1
  Filled 2012-04-04 (×12): qty 2
  Filled 2012-04-04: qty 1
  Filled 2012-04-04: qty 2
  Filled 2012-04-04: qty 1
  Filled 2012-04-04 (×9): qty 2

## 2012-04-04 MED ORDER — LORAZEPAM 2 MG/ML IJ SOLN
2.0000 mg | INTRAMUSCULAR | Status: DC | PRN
  Administered 2012-04-04 (×2): 2 mg via INTRAVENOUS
  Administered 2012-04-04: 1 mg via INTRAVENOUS
  Administered 2012-04-04 (×2): 2 mg via INTRAVENOUS
  Administered 2012-04-05: 1 mg via INTRAVENOUS
  Administered 2012-04-05 – 2012-04-07 (×10): 2 mg via INTRAVENOUS
  Filled 2012-04-04 (×15): qty 1
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
## 2012-04-05 DIAGNOSIS — D709 Neutropenia, unspecified: Secondary | ICD-10-CM

## 2012-04-05 DIAGNOSIS — D696 Thrombocytopenia, unspecified: Secondary | ICD-10-CM

## 2012-04-05 LAB — BASIC METABOLIC PANEL
BUN: <3 mg/dL — ABNORMAL LOW (ref 6–23)
CO2: 27 mEq/L (ref 19–32)
Chloride: 104 mEq/L (ref 96–112)
GFR calc non Af Amer: >90 mL/min (ref >90)
Glucose, Bld: 110 mg/dL — ABNORMAL HIGH (ref 70–99)
Potassium: 3.6 mEq/L (ref 3.5–5.1)

## 2012-04-05 LAB — CBC
HCT: 27.9 % — ABNORMAL LOW (ref 39.0–52.0)
Hemoglobin: 10 g/dL — ABNORMAL LOW (ref 13.0–17.0)
MCHC: 35.8 g/dL (ref 30.0–36.0)

## 2012-04-05 MED ORDER — HYDROMORPHONE HCL PF 2 MG/ML IJ SOLN
INTRAMUSCULAR | Status: AC
  Administered 2012-04-05: 8 mg
  Filled 2012-04-05: qty 4

## 2012-04-05 MED ORDER — ALPRAZOLAM 1 MG PO TABS
1.0000 mg | ORAL_TABLET | Freq: Four times a day (QID) | ORAL | Status: DC | PRN
  Administered 2012-04-06 (×2): 2 mg via ORAL
  Filled 2012-04-05: qty 1
  Filled 2012-04-05 (×2): qty 2

## 2012-04-05 MED ORDER — LORAZEPAM 2 MG/ML IJ SOLN
INTRAMUSCULAR | Status: AC
  Administered 2012-04-05: 1 mg
  Filled 2012-04-05: qty 1

## 2012-04-05 MED ORDER — HYDROMORPHONE HCL PF 4 MG/ML IJ SOLN
8.0000 mg | Freq: Once | INTRAMUSCULAR | Status: AC
  Administered 2012-04-05: 8 mg via INTRAVENOUS

## 2012-04-05 MED ORDER — POLYETHYLENE GLYCOL 3350 17 G PO PACK
17.0000 g | PACK | Freq: Every day | ORAL | Status: DC
  Administered 2012-04-05: 17 g via ORAL
  Filled 2012-04-05 (×4): qty 1

## 2012-04-05 MED ORDER — LORAZEPAM 2 MG/ML IJ SOLN
1.0000 mg | Freq: Once | INTRAMUSCULAR | Status: AC
  Administered 2012-04-05: 1 mg via INTRAVENOUS

## 2012-04-05 NOTE — Progress Notes (Signed)
Subjective: patient fast asleep ignored my requests to have him wake up. He does not seem to be in pain.  Objective: Blood pressure 112/73, pulse 93, temperature 98.4 F (36.9 C), temperature source Oral, resp. rate 17, height 6' (1.829 m), weight 144 lb (65.318 kg), SpO2 97.00%.  Remainder of the exam not done. Patient however was very comfortable and good rhythmic breathing and all pulses intact.  LABS:   Results for ALIF, PETRAK (MRN [id number]) as of 04/05/2012 08:30  Ref. Range 04/05/2012 04:13  Sodium Latest Range: 135-145 mEq/L 138  Potassium Latest Range: 3.5-5.1 mEq/L 3.6  Chloride Latest Range: 96-112 mEq/L 104  CO2 Latest Range: 19-32 mEq/L 27  BUN Latest Range: 6-23 mg/dL <3 (L)  Creat Latest Range: 0.50-1.35 mg/dL 1.30  Calcium Latest Range: 8.4-10.5 mg/dL 9.1  GFR calc non Af Amer Latest Range: >90 mL/min >90  GFR calc Af Amer Latest Range: >90 mL/min >90  Glucose Latest Range: 70-99 mg/dL 865 (H)  WBC Latest Range: 4.0-10.0 10e3/uL 0.7 (LL)  RBC Latest Range: 4.22-5.81 MIL/uL 3.41 (L)  HGB Latest Range: 13.0-17.0 g/dL 78.4 (L)  HCT Latest Range: 39.0-52.0 % 27.9 (L)  MCV Latest Range: 78.0-100.0 fL 81.8  MCH Latest Range: 26.0-34.0 pg 29.3  MCHC Latest Range: 30.0-36.0 g/dL 69.6  RDW Latest Range: 11.5-15.5 % 13.2  Platelets Latest Range: 150-400 K/uL 121 (L)   A/P: 1. Neutropenic but afebrile  2. Thrombocytopenia but no evidence of bleeding  3. Pain under good control  4. Not ready to go home today.

## 2012-04-06 LAB — CBC
HCT: 28.4 % — ABNORMAL LOW (ref 39.0–52.0)
Hemoglobin: 10.3 g/dL — ABNORMAL LOW (ref 13.0–17.0)
MCH: 29.7 pg (ref 26.0–34.0)
MCHC: 36.3 g/dL — ABNORMAL HIGH (ref 30.0–36.0)
MCV: 81.8 fL (ref 78.0–100.0)

## 2012-04-06 LAB — BASIC METABOLIC PANEL
BUN: 4 mg/dL — ABNORMAL LOW (ref 6–23)
Calcium: 9.2 mg/dL (ref 8.4–10.5)
Creatinine, Ser: 0.57 mg/dL (ref 0.50–1.35)
GFR calc non Af Amer: >90 mL/min (ref >90)
Glucose, Bld: 100 mg/dL — ABNORMAL HIGH (ref 70–99)

## 2012-04-06 NOTE — Care Management (Signed)
Dilaudid 8mg  Q2H PRN was allowed to be given at 1704 and pt requested this medication a few minutes prior to the allowable administration time. At approximately 1715, I entered the room to give the pt this medication along with 25mg  Phenergan Q4H PRN which was allowable at this time as well and previously requested by pt to be administered at the next allowable time. I addressed the pt by name "Andrew Dalton" while touching his arm lightly (2 attempts), then addressing him very loudly and shaking his arm (2 attempts). The pt opened his eyes halfway and mumbled and went back to sleep each time. As the pt is on frequent heavy narcotic doses, I told him that if he was not easily arousable, I would not make a strong attempt to wake him to administer more medication.  I repeated my verbal/physical attempts to wake pt approximately every 20-30 minutes; pt was in no acute distress and RR was regular and symmetrical each time; pt did not awake fully to receive narcotic medication.  At approximately 2002, the nurse tech entered the room to take vital signs and the pt awoke and is now requesting medication.

## 2012-04-07 NOTE — Progress Notes (Signed)
Pt refused for RN to draw labs. Would like MD to explain the need for  Lab draw this a.m.

## 2012-04-07 NOTE — Progress Notes (Signed)
Pt D/C home. Pt is alert, in stable condition, no new complains. D/C instructions done. Pt verbalizes understanding.

## 2012-04-10 ENCOUNTER — Other Ambulatory Visit: Payer: Self-pay | Admitting: *Deleted

## 2012-04-10 ENCOUNTER — Telehealth: Payer: Self-pay | Admitting: Hematology & Oncology

## 2012-04-10 ENCOUNTER — Telehealth: Payer: Self-pay | Admitting: *Deleted

## 2012-04-10 NOTE — Telephone Encounter (Signed)
Pt's mother Andrew Dalton called stating Andrew Dalton has voluntarily admitted himself to the psych facility at the [place]. She will keep [place] updated on the status of his admission. At this time she is unaware of how long he will be there but wanted Dr Myna Hidalgo to be aware. She wasn't sure whether or not the [place] or Dr Myna Hidalgo would want Andrew Dalton to have his chemo next week but will let the office know something more tomorrow.   Informed Dr Myna Hidalgo of the situation. He stated at this point, his mental health is the priority and the chemo can be postponed until he is finished the inpatient treatment. Will relay this to Deb when she calls back.

## 2012-04-10 NOTE — Telephone Encounter (Signed)
Error

## 2012-04-10 NOTE — Telephone Encounter (Signed)
Faxed records to [facility] per RN request.

## 2012-04-14 ENCOUNTER — Other Ambulatory Visit: Payer: Self-pay | Admitting: *Deleted

## 2012-04-14 ENCOUNTER — Encounter: Payer: Self-pay | Admitting: Psychiatry

## 2012-04-14 DIAGNOSIS — F909 Attention-deficit hyperactivity disorder, unspecified type: Secondary | ICD-10-CM

## 2012-04-14 DIAGNOSIS — IMO0002 Reserved for concepts with insufficient information to code with codable children: Secondary | ICD-10-CM

## 2012-04-14 MED ORDER — HYDROMORPHONE HCL 8 MG PO TABS
ORAL_TABLET | ORAL | Status: DC
Start: 2012-04-14 — End: 2012-05-27

## 2012-04-14 MED ORDER — AMPHETAMINE-DEXTROAMPHETAMINE 20 MG PO TABS: 20.0000 mg | ORAL_TABLET | Freq: Every day | ORAL | Status: DC

## 2012-04-14 MED ORDER — TRAZODONE HCL 50 MG PO TABS: 50.0000 mg | ORAL_TABLET | Freq: Every day | ORAL | Status: DC

## 2012-04-14 NOTE — Telephone Encounter (Signed)
Pt's mother called. [facility]

## 2012-04-15 ENCOUNTER — Other Ambulatory Visit: Payer: Non-veteran care | Admitting: Lab

## 2012-04-15 ENCOUNTER — Ambulatory Visit: Payer: Non-veteran care | Admitting: Hematology & Oncology

## 2012-04-15 ENCOUNTER — Ambulatory Visit: Payer: Non-veteran care

## 2012-04-17 ENCOUNTER — Other Ambulatory Visit: Payer: Non-veteran care | Admitting: Lab

## 2012-04-17 ENCOUNTER — Ambulatory Visit: Payer: Non-veteran care
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
## 2012-05-06 ENCOUNTER — Ambulatory Visit: Payer: Non-veteran care

## 2012-05-06 ENCOUNTER — Other Ambulatory Visit (HOSPITAL_BASED_OUTPATIENT_CLINIC_OR_DEPARTMENT_OTHER): Payer: Non-veteran care | Admitting: Lab

## 2012-05-06 ENCOUNTER — Ambulatory Visit: Payer: Non-veteran care | Admitting: Hematology & Oncology

## 2012-05-22 ENCOUNTER — Other Ambulatory Visit: Payer: Self-pay | Admitting: *Deleted

## 2012-05-22 ENCOUNTER — Ambulatory Visit (HOSPITAL_BASED_OUTPATIENT_CLINIC_OR_DEPARTMENT_OTHER)

## 2012-05-22 VITALS — BP 140/83 | HR 79 | Temp 97.1°F

## 2012-05-22 DIAGNOSIS — IMO0002 Reserved for concepts with insufficient information to code with codable children: Secondary | ICD-10-CM

## 2012-05-22 DIAGNOSIS — Z95828 Presence of other vascular implants and grafts: Secondary | ICD-10-CM | POA: Insufficient documentation

## 2012-05-22 DIAGNOSIS — Z452 Encounter for adjustment and management of vascular access device: Secondary | ICD-10-CM

## 2012-05-22 DIAGNOSIS — C801 Malignant (primary) neoplasm, unspecified: Secondary | ICD-10-CM

## 2012-05-22 MED ORDER — HEPARIN SOD (PORK) LOCK FLUSH 100 UNIT/ML IV SOLN
500.0000 [IU] | Freq: Once | INTRAVENOUS | Status: AC
  Administered 2012-05-22: 500 [IU] via INTRAVENOUS
  Filled 2012-05-22: qty 5

## 2012-05-22 MED ORDER — SODIUM CHLORIDE 0.9 % IJ SOLN
10.0000 mL | INTRAMUSCULAR | Status: DC | PRN
  Administered 2012-05-22: 10 mL via INTRAVENOUS
  Filled 2012-05-22: qty 10

## 2012-05-22 MED ORDER — SODIUM CHLORIDE 0.9 % IJ SOLN
10.0000 mL | Freq: Once | INTRAMUSCULAR | Status: DC
  Filled 2012-05-22: qty 10

## 2012-05-22 MED ORDER — HEPARIN SOD (PORK) LOCK FLUSH 100 UNIT/ML IV SOLN
500.0000 [IU] | Freq: Once | INTRAVENOUS | Status: DC
  Filled 2012-05-22: qty 5

## 2012-05-22 NOTE — Progress Notes (Signed)
Pt's mother called. His port is sore and "just feels different". Due to have chemo next week. They wanted to know if he could come in to have it checked. Made an appt for 2 pm today. Will ask Raiford Noble to place it in the computer.

## 2012-05-26 ENCOUNTER — Other Ambulatory Visit (HOSPITAL_BASED_OUTPATIENT_CLINIC_OR_DEPARTMENT_OTHER): Payer: Non-veteran care | Admitting: Lab

## 2012-05-26 ENCOUNTER — Ambulatory Visit (HOSPITAL_BASED_OUTPATIENT_CLINIC_OR_DEPARTMENT_OTHER): Payer: Non-veteran care

## 2012-05-26 VITALS — BP 146/94 | HR 92 | Temp 98.8°F | Wt 143.0 lb

## 2012-05-26 DIAGNOSIS — IMO0002 Reserved for concepts with insufficient information to code with codable children: Secondary | ICD-10-CM

## 2012-05-26 DIAGNOSIS — Z5111 Encounter for antineoplastic chemotherapy: Secondary | ICD-10-CM

## 2012-05-26 DIAGNOSIS — C6290 Malignant neoplasm of unspecified testis, unspecified whether descended or undescended: Secondary | ICD-10-CM

## 2012-05-26 LAB — COMPREHENSIVE METABOLIC PANEL
Alkaline Phosphatase: 85 U/L (ref 39–117)
BUN: 5 mg/dL — ABNORMAL LOW (ref 6–23)
CO2: 27 mEq/L (ref 19–32)
Creatinine, Ser: 0.85 mg/dL (ref 0.50–1.35)
Glucose, Bld: 105 mg/dL — ABNORMAL HIGH (ref 70–99)
Sodium: 142 mEq/L (ref 135–145)
Total Bilirubin: 0.9 mg/dL (ref 0.3–1.2)
Total Protein: 7.4 g/dL (ref 6.0–8.3)

## 2012-05-26 LAB — CBC WITH DIFFERENTIAL (CANCER CENTER ONLY)
BASO#: 0 10*3/uL (ref 0.0–0.2)
Eosinophils Absolute: 0 10*3/uL (ref 0.0–0.5)
HCT: 41.7 % (ref 38.7–49.9)
LYMPH%: 12.3 % — ABNORMAL LOW (ref 14.0–48.0)
MCH: 30.5 pg (ref 28.0–33.4)
MCV: 86 fL (ref 82–98)
MONO#: 0.4 10*3/uL (ref 0.1–0.9)
MONO%: 6.1 % (ref 0.0–13.0)
NEUT%: 80.8 % — ABNORMAL HIGH (ref 40.0–80.0)
Platelets: 229 10*3/uL (ref 145–400)
RBC: 4.88 10*6/uL (ref 4.20–5.70)
WBC: 6.6 10*3/uL (ref 4.0–10.0)

## 2012-05-26 LAB — LACTATE DEHYDROGENASE: LDH: 138 U/L (ref 94–250)

## 2012-05-26 MED ORDER — SODIUM CHLORIDE 0.9 % IV SOLN
Freq: Once | INTRAVENOUS | Status: AC
  Administered 2012-05-26: 14:00:00 via INTRAVENOUS

## 2012-05-27 ENCOUNTER — Other Ambulatory Visit: Payer: Self-pay | Admitting: *Deleted

## 2012-05-27 ENCOUNTER — Other Ambulatory Visit: Payer: Non-veteran care | Admitting: Lab

## 2012-05-27 ENCOUNTER — Ambulatory Visit (HOSPITAL_BASED_OUTPATIENT_CLINIC_OR_DEPARTMENT_OTHER): Payer: Non-veteran care

## 2012-05-27 ENCOUNTER — Ambulatory Visit (HOSPITAL_BASED_OUTPATIENT_CLINIC_OR_DEPARTMENT_OTHER): Admitting: Hematology & Oncology

## 2012-05-27 VITALS — BP 140/79 | HR 118 | Temp 97.6°F | Ht 70.0 in | Wt 143.0 lb

## 2012-05-27 DIAGNOSIS — IMO0002 Reserved for concepts with insufficient information to code with codable children: Secondary | ICD-10-CM

## 2012-05-27 DIAGNOSIS — Z5111 Encounter for antineoplastic chemotherapy: Secondary | ICD-10-CM

## 2012-05-27 DIAGNOSIS — F901 Attention-deficit hyperactivity disorder, predominantly hyperactive type: Secondary | ICD-10-CM

## 2012-05-27 MED ORDER — TRAZODONE HCL 50 MG PO TABS
50.0000 mg | ORAL_TABLET | Freq: Every day | ORAL | Status: DC
Start: 2012-05-27 — End: 2012-10-16

## 2012-05-27 MED ORDER — PRAZOSIN HCL 2 MG PO CAPS: 2.0000 mg | ORAL_CAPSULE | Freq: Every day | ORAL | Status: DC

## 2012-05-27 MED ORDER — OXYCODONE HCL 15 MG PO TABS: 15.0000 mg | ORAL_TABLET | Freq: Four times a day (QID) | ORAL | Status: DC | PRN

## 2012-05-27 MED ORDER — SODIUM CHLORIDE 0.9 % IV SOLN
150.0000 mg | Freq: Once | INTRAVENOUS | Status: AC
  Administered 2012-05-27: 150 mg via INTRAVENOUS
  Filled 2012-05-27: qty 5

## 2012-05-27 MED ORDER — SODIUM CHLORIDE 0.9 % IV SOLN
Freq: Once | INTRAVENOUS | Status: AC
  Administered 2012-05-27: 10:00:00 via INTRAVENOUS

## 2012-05-27 MED ORDER — DEXAMETHASONE SODIUM PHOSPHATE 4 MG/ML IJ SOLN
12.0000 mg | Freq: Once | INTRAMUSCULAR | Status: AC
  Administered 2012-05-27: 12 mg via INTRAVENOUS

## 2012-05-27 MED ORDER — SODIUM CHLORIDE 0.9 % IV SOLN
675.0000 mg/m2 | Freq: Once | INTRAVENOUS | Status: AC
  Administered 2012-05-27: 1320 mg via INTRAVENOUS
  Filled 2012-05-27: qty 66

## 2012-05-27 MED ORDER — HEPARIN SOD (PORK) LOCK FLUSH 100 UNIT/ML IV SOLN
250.0000 [IU] | Freq: Once | INTRAVENOUS | Status: DC | PRN
  Filled 2012-05-27: qty 5

## 2012-05-27 MED ORDER — VINCRISTINE SULFATE CHEMO INJECTION 1 MG/ML
1.5000 mg | Freq: Once | INTRAVENOUS | Status: AC
  Administered 2012-05-27: 1.5 mg via INTRAVENOUS
  Filled 2012-05-27: qty 1.5

## 2012-05-27 MED ORDER — ZOLPIDEM TARTRATE ER 12.5 MG PO TBCR: 12.5000 mg | EXTENDED_RELEASE_TABLET | Freq: Every evening | ORAL | Status: DC | PRN

## 2012-05-27 MED ORDER — DOXORUBICIN HCL CHEMO IV INJECTION 2 MG/ML
45.0000 mg/m2 | Freq: Once | INTRAVENOUS | Status: AC
  Administered 2012-05-27: 88 mg via INTRAVENOUS
  Filled 2012-05-27: qty 44

## 2012-05-27 MED ORDER — ALTEPLASE 2 MG IJ SOLR
2.0000 mg | Freq: Once | INTRAMUSCULAR | Status: DC | PRN
  Filled 2012-05-27: qty 2

## 2012-05-27 MED ORDER — SODIUM CHLORIDE 0.9 % IJ SOLN
3.0000 mL | INTRAMUSCULAR | Status: DC | PRN
  Filled 2012-05-27: qty 10

## 2012-05-27 MED ORDER — OXYCODONE HCL 20 MG PO TB12: 20.0000 mg | ORAL_TABLET | Freq: Two times a day (BID) | ORAL | Status: DC

## 2012-05-27 MED ORDER — ALPRAZOLAM 1 MG PO TABS: ORAL_TABLET | ORAL | Status: DC

## 2012-05-27 MED ORDER — PALONOSETRON HCL INJECTION 0.25 MG/5ML
0.2500 mg | Freq: Once | INTRAVENOUS | Status: AC
  Administered 2012-05-27: 0.25 mg via INTRAVENOUS

## 2012-05-27 MED ORDER — SODIUM CHLORIDE 0.9 % IJ SOLN
10.0000 mL | INTRAMUSCULAR | Status: DC | PRN
  Administered 2012-05-27: 10 mL
  Filled 2012-05-27: qty 10

## 2012-05-27 MED ORDER — AMPHETAMINE-DEXTROAMPHETAMINE 20 MG PO TABS: 20.0000 mg | ORAL_TABLET | Freq: Every day | ORAL | Status: DC

## 2012-05-27 MED ORDER — HEPARIN SOD (PORK) LOCK FLUSH 100 UNIT/ML IV SOLN
500.0000 [IU] | Freq: Once | INTRAVENOUS | Status: AC | PRN
  Administered 2012-05-27: 500 [IU]
  Filled 2012-05-27: qty 5

## 2012-05-27 NOTE — Patient Instructions (Addendum)
Constipation in Adults Constipation is having fewer than 2 bowel movements per week. Usually, the stools are hard. As we grow older, constipation is more common. If you try to fix constipation with laxatives, the problem may get worse. This is because laxatives taken over a long period of time make the colon muscles weaker. A low-fiber diet, not taking in enough fluids, and taking some medicines may make these problems worse. MEDICATIONS THAT MAY CAUSE CONSTIPATION  Water pills (diuretics).   Calcium channel blockers (used to control blood pressure and for the heart).   Certain pain medicines (narcotics).   Anticholinergics.   Anti-inflammatory agents.   Antacids that contain aluminum.  DISEASES THAT CONTRIBUTE TO CONSTIPATION  Diabetes.   Parkinson's disease.   Dementia.   Stroke.   Depression.   Illnesses that cause problems with salt and water metabolism.  HOME CARE INSTRUCTIONS   Constipation is usually best cared for without medicines. Increasing dietary fiber and eating more fruits and vegetables is the best way to manage constipation.   Slowly increase fiber intake to 25 to 38 grams per day. Whole grains, fruits, vegetables, and legumes are good sources of fiber. A dietitian can further help you incorporate high-fiber foods into your diet.   Drink enough water and fluids to keep your urine clear or pale yellow.   A fiber supplement may be added to your diet if you cannot get enough fiber from foods.   Increasing your activities also helps improve regularity.   Suppositories, as suggested by your caregiver, will also help. If you are using antacids, such as aluminum or calcium containing products, it will be helpful to switch to products containing magnesium if your caregiver says it is okay.   If you have been given a liquid injection (enema) today, this is only a temporary measure. It should not be relied on for treatment of longstanding (chronic) constipation.    Stronger measures, such as magnesium sulfate, should be avoided if possible. This may cause uncontrollable diarrhea. Using magnesium sulfate may not allow you time to make it to the bathroom.  SEEK IMMEDIATE MEDICAL CARE IF:   There is bright red blood in the stool.   The constipation stays for more than 4 days.   There is belly (abdominal) or rectal pain.   You do not seem to be getting better.   You have any questions or concerns.  MAKE SURE YOU:   Understand these instructions.   Will watch your condition.   Will get help right away if you are not doing well or get worse.  Document Released: 08/24/2004 Document Revised: 11/15/2011 Document Reviewed: 10/30/2011 [facility] Patient Information 2012 [place], [place].Doxorubicin injection What is this medicine? DOXORUBICIN (dox oh ROO bi sin) is a chemotherapy drug. It is used to treat many kinds of cancer like Hodgkin's disease, leukemia, non-Hodgkin's lymphoma, neuroblastoma, sarcoma, and Wilms' tumor. It is also used to treat bladder cancer, breast cancer, lung cancer, ovarian cancer, stomach cancer, and thyroid cancer. This medicine may be used for other purposes; ask your health care provider or pharmacist if you have questions. What should I tell my health care provider before I take this medicine? They need to know if you have any of these conditions: -blood disorders -heart disease, recent heart attack -infection (especially a virus infection such as chickenpox, cold sores, or herpes) -irregular heartbeat -liver disease -recent or ongoing radiation therapy -an unusual or allergic reaction to doxorubicin, other chemotherapy agents, other medicines, foods, dyes, or preservatives -pregnant  or trying to get pregnant -breast-feeding How should I use this medicine? This drug is given as an infusion into a vein. It is administered in a hospital or clinic by a specially trained health care professional. If you have pain, swelling,  burning or any unusual feeling around the site of your injection, tell your health care professional right away. Talk to your pediatrician regarding the use of this medicine in children. Special care may be needed. Overdosage: If you think you have taken too much of this medicine contact a poison control center or emergency room at once. NOTE: This medicine is only for you. Do not share this medicine with others. What if I miss a dose? It is important not to miss your dose. Call your doctor or health care professional if you are unable to keep an appointment. What may interact with this medicine? Do not take this medicine with any of the following medications: -cisapride -droperidol -halofantrine -pimozide -zidovudine This medicine may also interact with the following medications: -chloroquine -chlorpromazine -clarithromycin -cyclophosphamide -cyclosporine -erythromycin -medicines for depression, anxiety, or psychotic disturbances -medicines for irregular heart beat like amiodarone, bepridil, dofetilide, encainide, flecainide, propafenone, quinidine -medicines for seizures like ethotoin, fosphenytoin, phenytoin -medicines for nausea, vomiting like dolasetron, ondansetron, palonosetron -medicines to increase blood counts like filgrastim, pegfilgrastim, sargramostim -methadone -methotrexate -pentamidine -progesterone -vaccines -verapamil Talk to your doctor or health care professional before taking any of these medicines: -acetaminophen -aspirin -ibuprofen -ketoprofen -naproxen This list may not describe all possible interactions. Give your health care provider a list of all the medicines, herbs, non-prescription drugs, or dietary supplements you use. Also tell them if you smoke, drink alcohol, or use illegal drugs. Some items may interact with your medicine. What should I watch for while using this medicine? Your condition will be monitored carefully while you are receiving this  medicine. You will need important blood work done while you are taking this medicine. This drug may make you feel generally unwell. This is not uncommon, as chemotherapy can affect healthy cells as well as cancer cells. Report any side effects. Continue your course of treatment even though you feel ill unless your doctor tells you to stop. Your urine may turn red for a few days after your dose. This is not blood. If your urine is dark or brown, call your doctor. In some cases, you may be given additional medicines to help with side effects. Follow all directions for their use. Call your doctor or health care professional for advice if you get a fever, chills or sore throat, or other symptoms of a cold or flu. Do not treat yourself. This drug decreases your body's ability to fight infections. Try to avoid being around people who are sick. This medicine may increase your risk to bruise or bleed. Call your doctor or health care professional if you notice any unusual bleeding. Be careful brushing and flossing your teeth or using a toothpick because you may get an infection or bleed more easily. If you have any dental work done, tell your dentist you are receiving this medicine. Avoid taking products that contain aspirin, acetaminophen, ibuprofen, naproxen, or ketoprofen unless instructed by your doctor. These medicines may hide a fever. Men and women of childbearing age should use effective birth control methods while using taking this medicine. Do not become pregnant while taking this medicine. There is a potential for serious side effects to an unborn child. Talk to your health care professional or pharmacist for more information. Do not breast-feed  an infant while taking this medicine. Do not let others touch your urine or other body fluids for 5 days after each treatment with this medicine. Caregivers should wear latex gloves to avoid touching body fluids during this time. What side effects may I notice  from receiving this medicine? Side effects that you should report to your doctor or health care professional as soon as possible: -allergic reactions like skin rash, itching or hives, swelling of the face, lips, or tongue -low blood counts - this medicine may decrease the number of white blood cells, red blood cells and platelets. You may be at increased risk for infections and bleeding. -signs of infection - fever or chills, cough, sore throat, pain or difficulty passing urine -signs of decreased platelets or bleeding - bruising, pinpoint red spots on the skin, black, tarry stools, blood in the urine -signs of decreased red blood cells - unusually weak or tired, fainting spells, lightheadedness -breathing problems -chest pain -fast, irregular heartbeat -mouth sores -nausea, vomiting -pain, swelling, redness at site where injected -pain, tingling, numbness in the hands or feet -swelling of ankles, feet, or hands -unusual bleeding or bruising Side effects that usually do not require medical attention (report to your doctor or health care professional if they continue or are bothersome): -diarrhea -facial flushing -hair loss -loss of appetite -missed menstrual periods -nail discoloration or damage -red or watery eyes -red colored urine -stomach upset This list may not describe all possible side effects. Call your doctor for medical advice about side effects. You may report side effects to FDA at 1-800-FDA-1088. Where should I keep my medicine? This drug is given in a hospital or clinic and will not be stored at home. NOTE: This sheet is a summary. It may not cover all possible information. If you have questions about this medicine, talk to your doctor, pharmacist, or health care provider.  2012, [employer]. (03/16/2008 5:07:32 PM)Vincristine injection What is this medicine? VINCRISTINE (vin KRIS teen) is a chemotherapy drug. It slows the growth of cancer cells. This medicine  is used to treat many types of cancer like Hodgkin's disease, leukemia, non-Hodgkin's lymphoma, neuroblastoma (brain cancer), rhabdomyosarcoma, and Wilms' tumor. This medicine may be used for other purposes; ask your health care provider or pharmacist if you have questions. What should I tell my health care provider before I take this medicine? They need to know if you have any of these conditions: -blood disorders -gout -infection (especially chickenpox, cold sores, or herpes) -kidney disease -liver disease -lung disease -nervous system disease like Charcot-Marie-Tooth (CMT) -recent or ongoing radiation therapy -an unusual or allergic reaction to vincristine, other chemotherapy agents, other medicines, foods, dyes, or preservatives -pregnant or trying to get pregnant -breast-feeding How should I use this medicine? This drug is given as an infusion into a vein. It is administered in a hospital or clinic by a specially trained health care professional. If you have pain, swelling, burning, or any unusual feeling around the site of your injection, tell your health care professional right away. Talk to your pediatrician regarding the use of this medicine in children. While this drug may be prescribed for selected conditions, precautions do apply. Overdosage: If you think you have taken too much of this medicine contact a poison control center or emergency room at once. NOTE: This medicine is only for you. Do not share this medicine with others. What if I miss a dose? It is important not to miss your dose. Call your doctor or health care  professional if you are unable to keep an appointment. What may interact with this medicine? Do not take this medicine with any of the following medications: -itraconazole -mibefradil -voriconazole This medicine may also interact with the following medications: -cyclosporine -erythromycin -fluconazole -ketoconazole -medicines for HIV like delavirdine,  efavirenz, nevirapine -medicines for seizures like ethotoin, fosphenotoin, phenytoin -medicines to increase blood counts like filgrastim, pegfilgrastim, sargramostim -other chemotherapy drugs like cisplatin, L-asparaginase, methotrexate, mitomycin, paclitaxel -pegaspargase -vaccines -zalcitabine, ddC Talk to your doctor or health care professional before taking any of these medicines: -acetaminophen -aspirin -ibuprofen -ketoprofen -naproxen This list may not describe all possible interactions. Give your health care provider a list of all the medicines, herbs, non-prescription drugs, or dietary supplements you use. Also tell them if you smoke, drink alcohol, or use illegal drugs. Some items may interact with your medicine. What should I watch for while using this medicine? Your condition will be monitored carefully while you are receiving this medicine. You will need important blood work done while you are taking this medicine. This drug may make you feel generally unwell. This is not uncommon, as chemotherapy can affect healthy cells as well as cancer cells. Report any side effects. Continue your course of treatment even though you feel ill unless your doctor tells you to stop. In some cases, you may be given additional medicines to help with side effects. Follow all directions for their use. Call your doctor or health care professional for advice if you get a fever, chills or sore throat, or other symptoms of a cold or flu. Do not treat yourself. Avoid taking products that contain aspirin, acetaminophen, ibuprofen, naproxen, or ketoprofen unless instructed by your doctor. These medicines may hide a fever. Do not become pregnant while taking this medicine. Women should inform their doctor if they wish to become pregnant or think they might be pregnant. There is a potential for serious side effects to an unborn child. Talk to your health care professional or pharmacist for more information. Do not  breast-feed an infant while taking this medicine. Men may have a lower sperm count while taking this medicine. Talk to your doctor if you plan to father a child. What side effects may I notice from receiving this medicine? Side effects that you should report to your doctor or health care professional as soon as possible: -allergic reactions like skin rash, itching or hives, swelling of the face, lips, or tongue -breathing problems -confusion or changes in emotions or moods -constipation -cough -mouth sores -muscle weakness -nausea and vomiting -pain, swelling, redness or irritation at the injection site -pain, tingling, numbness in the hands or feet -problems with balance, talking, walking -seizures -stomach pain -trouble passing urine or change in the amount of urine Side effects that usually do not require medical attention (report to your doctor or health care professional if they continue or are bothersome): -diarrhea -hair loss -jaw pain -loss of appetite This list may not describe all possible side effects. Call your doctor for medical advice about side effects. You may report side effects to FDA at 1-800-FDA-1088. Where should I keep my medicine? This drug is given in a hospital or clinic and will not be stored at home. NOTE: This sheet is a summary. It may not cover all possible information. If you have questions about this medicine, talk to your doctor, pharmacist, or health care provider.  2012, [employer] Standard. (08/23/2008 5:17:13 PM)Cyclophosphamide injection What is this medicine? CYCLOPHOSPHAMIDE (sye kloe FOSS fa mide) is a chemotherapy drug.  It slows the growth of cancer cells. This medicine is used to treat many types of cancer like lymphoma, myeloma, leukemia, breast cancer, and ovarian cancer, to name a few. It is also used to treat nephrotic syndrome in children. This medicine may be used for other purposes; ask your health care provider or pharmacist if you  have questions. What should I tell my health care provider before I take this medicine? They need to know if you have any of these conditions: -blood disorders -history of other chemotherapy -history of radiation therapy -infection -kidney disease -liver disease -tumors in the bone marrow -an unusual or allergic reaction to cyclophosphamide, other chemotherapy, other medicines, foods, dyes, or preservatives -pregnant or trying to get pregnant -breast-feeding How should I use this medicine? This drug is usually given as an injection into a vein or muscle or by infusion into a vein. It is administered in a hospital or clinic by a specially trained health care professional. Talk to your pediatrician regarding the use of this medicine in children. While this drug may be prescribed for selected conditions, precautions do apply. Overdosage: If you think you have taken too much of this medicine contact a poison control center or emergency room at once. NOTE: This medicine is only for you. Do not share this medicine with others. What if I miss a dose? It is important not to miss your dose. Call your doctor or health care professional if you are unable to keep an appointment. What may interact with this medicine? Do not take this medicine with any of the following medications: -mibefradil -nalidixic acid This medicine may also interact with the following medications: -doxorubicin -etanercept -medicines to increase blood counts like filgrastim, pegfilgrastim, sargramostim -medicines that block muscle or nerve pain -St. John's Wort -phenobarbital -succinylcholine chloride -trastuzumab -vaccines Talk to your doctor or health care professional before taking any of these medicines: -acetaminophen -aspirin -ibuprofen -ketoprofen -naproxen This list may not describe all possible interactions. Give your health care provider a list of all the medicines, herbs, non-prescription drugs, or dietary  supplements you use. Also tell them if you smoke, drink alcohol, or use illegal drugs. Some items may interact with your medicine. What should I watch for while using this medicine? Visit your doctor for checks on your progress. This drug may make you feel generally unwell. This is not uncommon, as chemotherapy can affect healthy cells as well as cancer cells. Report any side effects. Continue your course of treatment even though you feel ill unless your doctor tells you to stop. Drink water or other fluids as directed. Urinate often, even at night. In some cases, you may be given additional medicines to help with side effects. Follow all directions for their use. Call your doctor or health care professional for advice if you get a fever, chills or sore throat, or other symptoms of a cold or flu. Do not treat yourself. This drug decreases your body's ability to fight infections. Try to avoid being around people who are sick. This medicine may increase your risk to bruise or bleed. Call your doctor or health care professional if you notice any unusual bleeding. Be careful brushing and flossing your teeth or using a toothpick because you may get an infection or bleed more easily. If you have any dental work done, tell your dentist you are receiving this medicine. Avoid taking products that contain aspirin, acetaminophen, ibuprofen, naproxen, or ketoprofen unless instructed by your doctor. These medicines may hide a fever. Do not  become pregnant while taking this medicine. Women should inform their doctor if they wish to become pregnant or think they might be pregnant. There is a potential for serious side effects to an unborn child. Talk to your health care professional or pharmacist for more information. Do not breast-feed an infant while taking this medicine. Men should inform their doctor if they wish to father a child. This medicine may lower sperm counts. If you are going to have surgery, tell your  doctor or health care professional that you have taken this medicine. What side effects may I notice from receiving this medicine? Side effects that you should report to your doctor or health care professional as soon as possible: -allergic reactions like skin rash, itching or hives, swelling of the face, lips, or tongue -low blood counts - this medicine may decrease the number of white blood cells, red blood cells and platelets. You may be at increased risk for infections and bleeding. -signs of infection - fever or chills, cough, sore throat, pain or difficulty passing urine -signs of decreased platelets or bleeding - bruising, pinpoint red spots on the skin, black, tarry stools, blood in the urine -signs of decreased red blood cells - unusually weak or tired, fainting spells, lightheadedness -breathing problems -dark urine -mouth sores -pain, swelling, redness at site where injected -swelling of the ankles, feet, hands -trouble passing urine or change in the amount of urine -weight gain -yellowing of the eyes or skin Side effects that usually do not require medical attention (report to your doctor or health care professional if they continue or are bothersome): -changes in nail or skin color -diarrhea -hair loss -loss of appetite -missed menstrual periods -nausea, vomiting -stomach pain This list may not describe all possible side effects. Call your doctor for medical advice about side effects. You may report side effects to FDA at 1-800-FDA-1088. Where should I keep my medicine? This drug is given in a hospital or clinic and will not be stored at home. NOTE: This sheet is a summary. It may not cover all possible information. If you have questions about this medicine, talk to your doctor, pharmacist, or health care provider.  2012, [employer] Standard. (03/02/2008 2:32:25 PM)

## 2012-05-27 NOTE — Progress Notes (Signed)
This office note has been dictated.

## 2012-05-27 NOTE — Progress Notes (Signed)
DIAGNOSIS:  Anaplastic T-cell lymphoma.  CURRENT THERAPY:  The patient is status post 5 cycles of CHOP.  INTERIM HISTORY:  Andrew Dalton comes in for followup.  He has been in intensive therapy for his PTSD.  He was hospitalized back in late April. He had a lot of issues at that time.  He was just in no emotional state to take chemotherapy.  As such, we have had to hold his chemotherapy essentially for 2 months.  He is looking a whole lot better now.  He has lost a little weight.  He coming down off his pain medications.  He really wants to get off the pain medications if we can.  He is sleeping okay.  He is eating better.  He is not having problems with bowels or bladder.  He has had no cough.  She had no dysphagia or odynophagia.  He does have occasional headache.  He was in the office yesterday getting IV fluids for headache.  PHYSICAL EXAMINATION:  General:  This is a thin but well-nourished white gentleman in no obvious distress.  Vital signs:  Temperature of 97.6, pulse 118, respiratory rate 18, blood pressure 140/79.  Weight is 143. Head and neck:  Normocephalic, atraumatic skull.  There are no ocular or oral lesions.  There are no palpable cervical or supraclavicular lymph nodes.  Lungs:  Clear bilaterally.  Cardiac:  Tachycardic but regular. There are no murmurs, rubs or bruits.  Abdomen:  Soft with good bowel sounds.  There is no palpable abdominal mass.  There is no palpable hepatosplenomegaly.  Extremities:  Shows no clubbing, cyanosis or edema. Neurological:  Shows no focal neurological deficits.  Axillary exam does show a palpable 1 cm mobile, nontender lymph node in the right axilla. His left axilla shows no lymphadenopathy.  LABORATORY STUDIES:  Show a white cell count of 6.6, hemoglobin 14.9, hematocrit 41.7, platelet count 229.  Electrolytes all appear within normal limits.  LDH is 138.  BUN and creatinine 5 and 0.85.  Calcium is 10.3 with an albumin of  5.1.  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  He is doing better now.  He did have a very nice response to therapy.  After this 4th cycle, we did go ahead and do a PET scan on him.  This did show marked reduction in size and metabolic activity of his lymphadenopathy.  He essentially appeared to be in near complete response.  I do palpate the right axillary lymph node.  Again, this may be reactive or just residual scar.  Once we get done with his 8 cycles of treatment, we will move ahead and rescan him.  I am just glad to see that he is doing better mentally.  This has been a real struggle for him.  I will adjust the doses of his chemotherapy so that he will tolerate this better and hopefully not end up in the hospital.    ______________________________ Andrew Dalton, M.D. PRE/MEDQ  D:  05/27/2012  T:  05/27/2012  Job:  [id number]

## 2012-05-31 IMAGING — PT NM PET TUM IMG RESTAG (PS) SKULL BASE T - THIGH
6 series · 25 of 25 positions shown · non-contrast
Comparison: None

CLINICAL DATA: Subsequent treatment strategy for non-Hodgkins
lymphoma.

NUCLEAR MEDICINE PET CT RESTAGING (PS) SKULL BASE TO THIGH
TECHNIQUE: 18.4 mCi F-18 FDG was injected intravenously via the
right arm.  Full-ring PET imaging was performed from the skull base
through the mid-thighs 60  minutes after injection.  CT data was
obtained and used for attenuation correction and anatomic
localization only.  (This was not acquired as a diagnostic CT
examination.)
Fasting Blood Glucose:  84

[Series 1: pet ac · axial · 3.3mm · 4.69mm/px · z∈[-870,+0]mm · 5 of 267 slices shown]
[im 1/267]
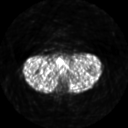
[im 67/267]
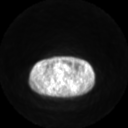
[im 134/267]
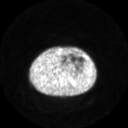
[im 200/267]
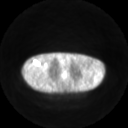
[im 267/267]
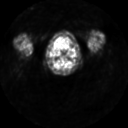

[Series 2: ct images · axial · 3.8mm · 0.98mm/px · z∈[-870,+0]mm · 6 of 267 slices shown]
[im 1/267]
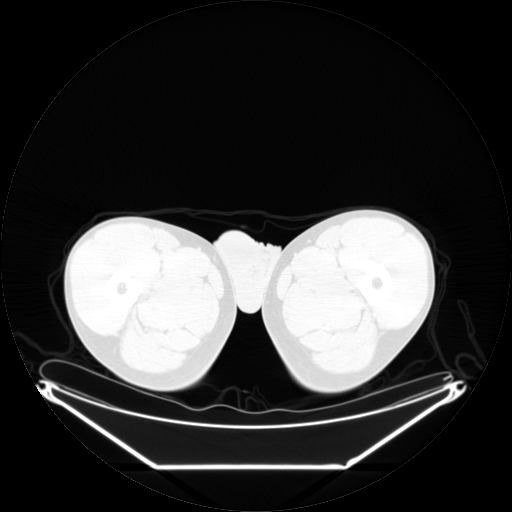
[im 54/267]
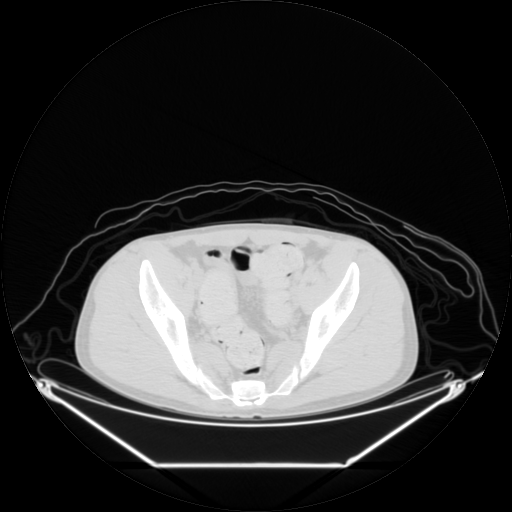
[im 107/267]
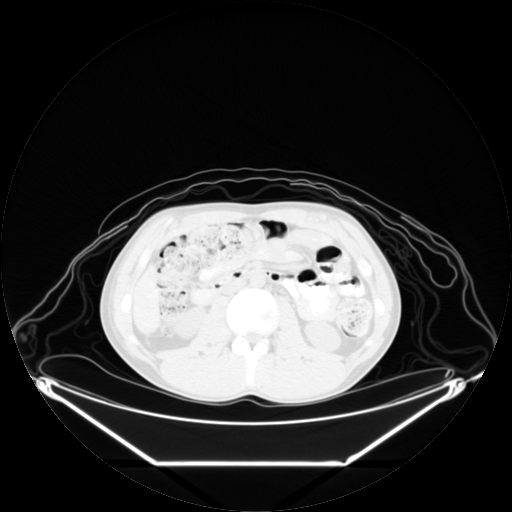
[im 160/267]
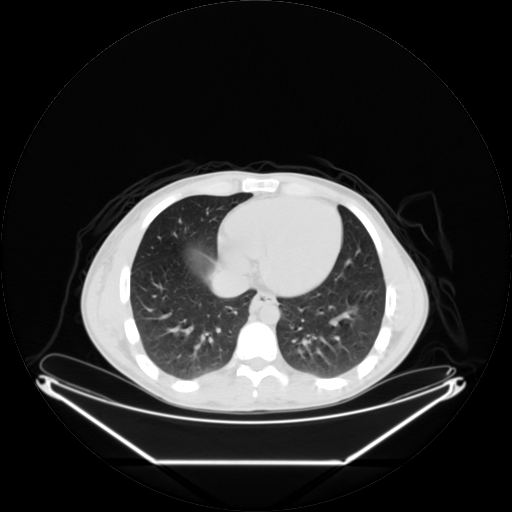
[im 213/267]
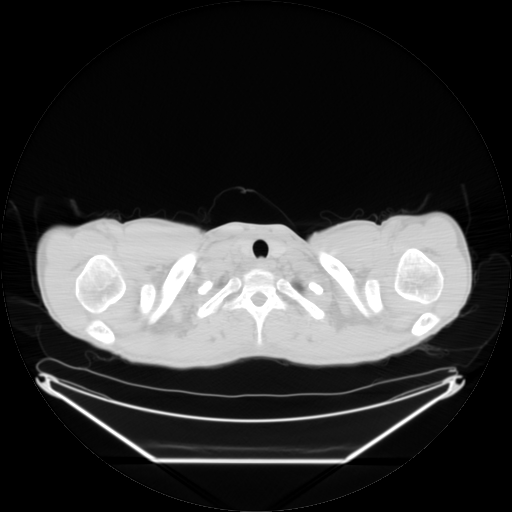
[im 267/267  brain]
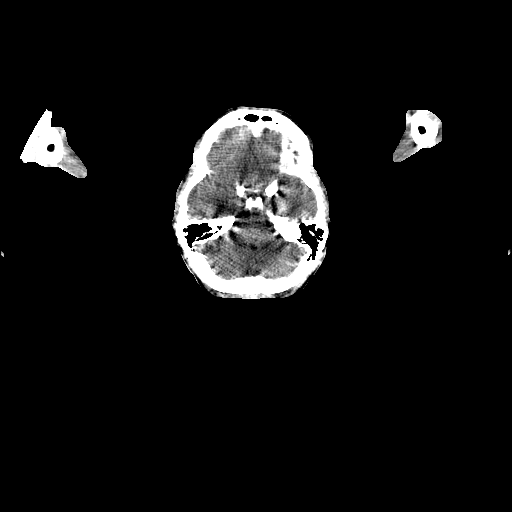

[Series 2: pet nac · axial · 3.3mm · 4.69mm/px · z∈[-870,+0]mm · 6 of 267 slices shown]
[im 1/267]
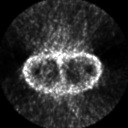
[im 54/267]
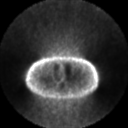
[im 107/267]
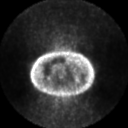
[im 160/267]
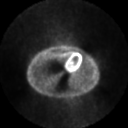
[im 213/267]
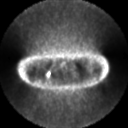
[im 267/267]
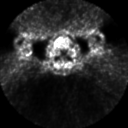

[Series 123: mip · coronal · 3.3mm · 4.69mm/px · 1 of 30 slices shown]
[im 1/30]
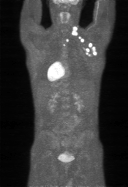

[Series 151: reformatted · axial · 3.3mm · 3.91mm/px · z∈[-870,+0]mm · 6 of 265 slices shown (1 of 2)]
[im 1/265]
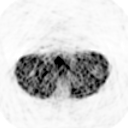
[im 53/265]
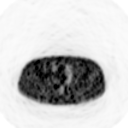
[im 106/265]
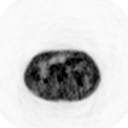
[im 159/265]
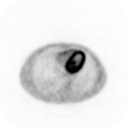
[im 212/265]
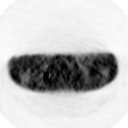
[im 265/265]
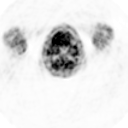

[Series 153: reformatted · coronal · 4.7mm · 6.98mm/px · 1 of 67 slices shown (2 of 2)]
[im 1/67]
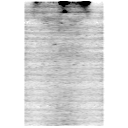

[25 of 25 positions shown; findings below may reference images not displayed]

FINDINGS: Neck:  There is hypermetabolic lymphadenopathy in the right middle
and lower jugular chains, and to a lesser degree the right
posterior triangle. Maximum SUV measures 15.4 There is also mild
hypermetabolic adenopathy in the superior mediastinum and right
supraclavicular region.

Chest:  Hypermetabolic lymphadenopathy is seen in the right axilla,
with largest individual lymph node measuring 2.1 cm in short axis.
Maximum SUV obtained within the right hilar adenopathy measures
22.8. Low grade hypermetabolic activity is noted within residual
thymic tissue in the anterior mediastinum.

Abdomen:  No hypermetabolic adenopathy or soft tissue masses
identified.

Pelvis:  No hypermetabolic adenopathy or soft tissue masses
identified.
IMPRESSION: 1.  Hypermetabolic adenopathy in the right neck, superior
mediastinum, and right axilla.
2.  No hypermetabolic adenopathy in the abdomen or pelvis.

## 2012-06-16 ENCOUNTER — Telehealth: Payer: Self-pay | Admitting: Hematology & Oncology

## 2012-06-16 NOTE — Telephone Encounter (Signed)
Faxed completed request form for treatment plan to [facility] for approval.

## 2012-06-17 ENCOUNTER — Ambulatory Visit (HOSPITAL_BASED_OUTPATIENT_CLINIC_OR_DEPARTMENT_OTHER): Payer: Non-veteran care | Admitting: Hematology & Oncology

## 2012-06-17 ENCOUNTER — Other Ambulatory Visit: Payer: Self-pay | Admitting: *Deleted

## 2012-06-17 ENCOUNTER — Other Ambulatory Visit (HOSPITAL_BASED_OUTPATIENT_CLINIC_OR_DEPARTMENT_OTHER): Payer: Non-veteran care | Admitting: Lab

## 2012-06-17 ENCOUNTER — Ambulatory Visit (HOSPITAL_BASED_OUTPATIENT_CLINIC_OR_DEPARTMENT_OTHER): Payer: Non-veteran care

## 2012-06-17 VITALS — BP 129/78 | HR 61 | Temp 97.9°F | Ht 70.0 in | Wt 143.0 lb

## 2012-06-17 DIAGNOSIS — IMO0002 Reserved for concepts with insufficient information to code with codable children: Secondary | ICD-10-CM

## 2012-06-17 DIAGNOSIS — F901 Attention-deficit hyperactivity disorder, predominantly hyperactive type: Secondary | ICD-10-CM

## 2012-06-17 DIAGNOSIS — Z5111 Encounter for antineoplastic chemotherapy: Secondary | ICD-10-CM

## 2012-06-17 LAB — CBC WITH DIFFERENTIAL (CANCER CENTER ONLY)
Eosinophils Absolute: 0 10*3/uL (ref 0.0–0.5)
HCT: 41.5 % (ref 38.7–49.9)
LYMPH%: 20.8 % (ref 14.0–48.0)
MCV: 86 fL (ref 82–98)
MONO#: 0.7 10*3/uL (ref 0.1–0.9)
NEUT%: 59.8 % (ref 40.0–80.0)
RBC: 4.85 10*6/uL (ref 4.20–5.70)
WBC: 3.7 10*3/uL — ABNORMAL LOW (ref 4.0–10.0)

## 2012-06-17 LAB — COMPREHENSIVE METABOLIC PANEL
BUN: 8 mg/dL (ref 6–23)
CO2: 31 mEq/L (ref 19–32)
Creatinine, Ser: 0.81 mg/dL (ref 0.50–1.35)
Glucose, Bld: 105 mg/dL — ABNORMAL HIGH (ref 70–99)
Total Bilirubin: 0.4 mg/dL (ref 0.3–1.2)

## 2012-06-17 LAB — LACTATE DEHYDROGENASE: LDH: 143 U/L (ref 94–250)

## 2012-06-17 LAB — TECHNOLOGIST REVIEW CHCC SATELLITE

## 2012-06-17 MED ORDER — AMPHETAMINE-DEXTROAMPHETAMINE 20 MG PO TABS: 20.0000 mg | ORAL_TABLET | Freq: Every day | ORAL | Status: DC

## 2012-06-17 MED ORDER — DEXAMETHASONE SODIUM PHOSPHATE 4 MG/ML IJ SOLN
12.0000 mg | Freq: Once | INTRAMUSCULAR | Status: AC
  Administered 2012-06-17: 12 mg via INTRAVENOUS

## 2012-06-17 MED ORDER — SODIUM CHLORIDE 0.9 % IJ SOLN
10.0000 mL | INTRAMUSCULAR | Status: DC | PRN
  Administered 2012-06-17: 10 mL
  Filled 2012-06-17: qty 10

## 2012-06-17 MED ORDER — SODIUM CHLORIDE 0.9 % IV SOLN
Freq: Once | INTRAVENOUS | Status: AC
  Administered 2012-06-17: 10:00:00 via INTRAVENOUS

## 2012-06-17 MED ORDER — DOXORUBICIN HCL CHEMO IV INJECTION 2 MG/ML
45.0000 mg/m2 | Freq: Once | INTRAVENOUS | Status: AC
  Administered 2012-06-17: 88 mg via INTRAVENOUS
  Filled 2012-06-17: qty 44

## 2012-06-17 MED ORDER — SODIUM CHLORIDE 0.9 % IV SOLN
150.0000 mg | Freq: Once | INTRAVENOUS | Status: AC
  Administered 2012-06-17: 150 mg via INTRAVENOUS
  Filled 2012-06-17: qty 5

## 2012-06-17 MED ORDER — OXYCODONE HCL 15 MG PO TB12: 15.0000 mg | ORAL_TABLET | Freq: Two times a day (BID) | ORAL | Status: DC

## 2012-06-17 MED ORDER — HEPARIN SOD (PORK) LOCK FLUSH 100 UNIT/ML IV SOLN
500.0000 [IU] | Freq: Once | INTRAVENOUS | Status: AC | PRN
  Administered 2012-06-17: 500 [IU]
  Filled 2012-06-17: qty 5

## 2012-06-17 MED ORDER — SODIUM CHLORIDE 0.9 % IV SOLN
675.0000 mg/m2 | Freq: Once | INTRAVENOUS | Status: AC
  Administered 2012-06-17: 1320 mg via INTRAVENOUS
  Filled 2012-06-17: qty 66

## 2012-06-17 MED ORDER — VINCRISTINE SULFATE CHEMO INJECTION 1 MG/ML
1.5000 mg | Freq: Once | INTRAVENOUS | Status: AC
  Administered 2012-06-17: 1.5 mg via INTRAVENOUS
  Filled 2012-06-17: qty 1.5

## 2012-06-17 MED ORDER — PALONOSETRON HCL INJECTION 0.25 MG/5ML
0.2500 mg | Freq: Once | INTRAVENOUS | Status: AC
  Administered 2012-06-17: 0.25 mg via INTRAVENOUS

## 2012-06-17 NOTE — Progress Notes (Signed)
DIAGNOSIS:  Anaplastic T cell lymphoma.  CURRENT THERAPY:  The patient is status post 6 cycles of CHOP.  INTERIM HISTORY:  Mr. Rhinehart comes in for followup.  He is really doing great.  He is looking better.  He feels better.  He is really adjusting to chemotherapy nicely.  We did make some dose adjustments with the chemotherapy.  He is active.  He is eating better.  He is not having problems with bowels or bladder.  There is no mucositis.  He has had no fevers or sweats.  He did have a PET scan done at the [place].  Unfortunately I do not have the results back from the [place].  Mr. Geerts brought the disk in for me.  I cannot open it up to get into the report, however.  He has had no rashes.  He has had no bleeding.  He has had no headache. He wants to keep cutting back on his pain medication.  PHYSICAL EXAMINATION:  General:  This is a well-developed, well- nourished white gentleman in no obvious distress.  Vital signs: Temperature of 97.9, pulse 61, respiratory rate 18, blood pressure 129/78.  Weight is 143.  Head and neck:  Shows a normocephalic, atraumatic skull.  There are no ocular or oral lesions.  There are no palpable cervical or supraclavicular lymph nodes.  Lungs:  Clear to percussion and auscultation bilaterally.  Cardiac:  Regular rate and rhythm with a normal S1 and S2.  There are no murmurs, rubs or bruits. Abdomen:  Soft with good bowel sounds.  There is no fluid wave.  There is no palpable inguinal adenopathy bilaterally.  There is no palpable hepatosplenomegaly.  Axillary exam:  Shows no left axillary adenopathy. Right axilla shows a 5 mm mobile, firm lymph node.  This is nontender. Back:  No tenderness over the spine, ribs or hips.  Extremities:  Shows no clubbing, cyanosis or edema.  Neurological:  Shows no focal neurological deficits.  Skin:  No rashes, ecchymoses or petechiae.  LABORATORY STUDIES:  White cell count 3.7, hemoglobin 14.8, hematocrit 41.5, platelet  count 288.  IMPRESSION:  Mr. 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  He is responding as I expected.  He looks good.  His blood counts have recovered nicely.  Of note, we did have a little bit of a delay in his therapy because he was hospitalized secondary to PTSD.  Hopefully, this delay will not lead to any issues with respect to recurrence risk in the future.  We will go ahead and plan for his 7th cycle of chemo.  We will then plan to get him back in 3 weeks for his 8th and hopefully final cycle of chemotherapy.  Again, I really need to somehow get a hold of this PET scan that he had done recently.    ______________________________ Josph Macho, M.D. PRE/MEDQ  D:  06/17/2012  T:  06/17/2012  Job:  [id number]  ADDENDUM:  PET scan dose not show any active NHL!!!!

## 2012-06-17 NOTE — Patient Instructions (Addendum)
Chemotherapy  Many people are apprehensive about chemotherapy due to concerns over uncomfortable side effects. However, managements for side effects have come a long way. Many side effects once associated with chemotherapy can be prevented and/or controlled.  WHAT IS CHEMOTHERAPY?  Chemotherapy is the general term for any treatment involving the use of chemical agents. Chemotherapy can be given through a vein, most commonly through an implanted port* or PICC line.* It can also be delivered by mouth (orally) in the form of a pill. The main goal of chemotherapy is to kill cancer cells and stop them from growing. It can destroy and eliminate cancer cells where the cancer started (primary tumor location) and throughout the body, often far away from the original cancer. It is a treatment that not only targets the original cancer location, but also the entire body (systemic treatment) for full effect and results.  Chemotherapy works by destroying cancer cells. Unfortunately, it cannot tell the difference between a cancer cell and some healthy cells. This results in the death of noncancerous cells, such as hair and blood cells. Harm to healthy cells is what causes side effects. These cells usually repair themselves after chemotherapy.  Because some drugs work better together rather than alone, 2 or more drugs are often given at the same time. This is called combination chemotherapy.  Depending on the type of cancer and how advanced it is, chemotherapy can be used for different goals:   Cure the cancer.   Keep the cancer from spreading.   Slow the cancer's growth.   Kill cancer cells that may have spread to other parts of the body from the original tumor.   Relieve symptoms caused by cancer.  You and your caregiver will decide what drug or combination of drugs you will get. Your caregiver will choose the doses, how the drugs will be given, how often, and how long you will get treatment. All of these decisions will  depend on the type of cancer, where it is, how big it is, and how it is affecting your normal body functions and overall health.  *Implanted port - A device that is implanted under your skin so that medicines may be delivered directly into your blood system.  *PICC line (peripherally inserted central catheter) - A long, slender, flexible tube. This tube is often inserted into a vein, typically in the upper arm. The tip stops in the large central vein that leads to your heart.  Document Released: 09/23/2007 Document Revised: 11/15/2011 Document Reviewed: 03/10/2009  [employer] Patient Information 2012 [employer].

## 2012-06-17 NOTE — Progress Notes (Signed)
This office note has been dictated.

## 2012-06-17 NOTE — Telephone Encounter (Signed)
Pt requested a refill for Adderal. Rx routed to Dr Myna Hidalgo for approval.

## 2012-06-20 ENCOUNTER — Encounter: Payer: Self-pay | Admitting: Hematology & Oncology

## 2012-07-08 ENCOUNTER — Ambulatory Visit (HOSPITAL_BASED_OUTPATIENT_CLINIC_OR_DEPARTMENT_OTHER): Payer: Non-veteran care | Admitting: Hematology & Oncology

## 2012-07-08 ENCOUNTER — Telehealth: Payer: Self-pay | Admitting: Hematology & Oncology

## 2012-07-08 ENCOUNTER — Ambulatory Visit (HOSPITAL_BASED_OUTPATIENT_CLINIC_OR_DEPARTMENT_OTHER): Payer: Non-veteran care

## 2012-07-08 ENCOUNTER — Other Ambulatory Visit (HOSPITAL_BASED_OUTPATIENT_CLINIC_OR_DEPARTMENT_OTHER): Payer: Non-veteran care | Admitting: Lab

## 2012-07-08 VITALS — BP 136/84 | HR 90 | Temp 97.1°F | Ht 70.0 in | Wt 143.0 lb

## 2012-07-08 DIAGNOSIS — Z5111 Encounter for antineoplastic chemotherapy: Secondary | ICD-10-CM

## 2012-07-08 DIAGNOSIS — IMO0002 Reserved for concepts with insufficient information to code with codable children: Secondary | ICD-10-CM

## 2012-07-08 DIAGNOSIS — F419 Anxiety disorder, unspecified: Secondary | ICD-10-CM

## 2012-07-08 DIAGNOSIS — F901 Attention-deficit hyperactivity disorder, predominantly hyperactive type: Secondary | ICD-10-CM

## 2012-07-08 DIAGNOSIS — C8590 Non-Hodgkin lymphoma, unspecified, unspecified site: Secondary | ICD-10-CM

## 2012-07-08 LAB — LACTATE DEHYDROGENASE: LDH: 151 U/L (ref 94–250)

## 2012-07-08 LAB — COMPREHENSIVE METABOLIC PANEL
ALT: 14 U/L (ref 0–53)
AST: 18 U/L (ref 0–37)
BUN: 9 mg/dL (ref 6–23)
Creatinine, Ser: 0.8 mg/dL (ref 0.50–1.35)
Total Bilirubin: 0.4 mg/dL (ref 0.3–1.2)

## 2012-07-08 LAB — CBC WITH DIFFERENTIAL (CANCER CENTER ONLY)
BASO#: 0 10*3/uL (ref 0.0–0.2)
BASO%: 0.9 % (ref 0.0–2.0)
EOS%: 1.2 % (ref 0.0–7.0)
HCT: 38.9 % (ref 38.7–49.9)
LYMPH%: 17.3 % (ref 14.0–48.0)
MCH: 30.8 pg (ref 28.0–33.4)
MCHC: 36 g/dL — ABNORMAL HIGH (ref 32.0–35.9)
MCV: 86 fL (ref 82–98)
NEUT%: 52.2 % (ref 40.0–80.0)
RDW: 14.3 % (ref 11.1–15.7)

## 2012-07-08 MED ORDER — OXYCODONE HCL 10 MG PO TB12: 10.0000 mg | ORAL_TABLET | Freq: Two times a day (BID) | ORAL | Status: DC

## 2012-07-08 MED ORDER — SODIUM CHLORIDE 0.9 % IJ SOLN
10.0000 mL | INTRAMUSCULAR | Status: DC | PRN
  Administered 2012-07-08: 10 mL
  Filled 2012-07-08: qty 10

## 2012-07-08 MED ORDER — SODIUM CHLORIDE 0.9 % IV SOLN
Freq: Once | INTRAVENOUS | Status: AC
  Administered 2012-07-08: 10:00:00 via INTRAVENOUS

## 2012-07-08 MED ORDER — SODIUM CHLORIDE 0.9 % IV SOLN
675.0000 mg/m2 | Freq: Once | INTRAVENOUS | Status: AC
  Administered 2012-07-08: 1320 mg via INTRAVENOUS
  Filled 2012-07-08: qty 66

## 2012-07-08 MED ORDER — SODIUM CHLORIDE 0.9 % IV SOLN
150.0000 mg | Freq: Once | INTRAVENOUS | Status: AC
  Administered 2012-07-08: 150 mg via INTRAVENOUS
  Filled 2012-07-08: qty 5

## 2012-07-08 MED ORDER — PALONOSETRON HCL INJECTION 0.25 MG/5ML
0.2500 mg | Freq: Once | INTRAVENOUS | Status: AC
  Administered 2012-07-08: 0.25 mg via INTRAVENOUS

## 2012-07-08 MED ORDER — AMPHETAMINE-DEXTROAMPHETAMINE 20 MG PO TABS: 20.0000 mg | ORAL_TABLET | Freq: Every day | ORAL | Status: DC

## 2012-07-08 MED ORDER — HEPARIN SOD (PORK) LOCK FLUSH 100 UNIT/ML IV SOLN
500.0000 [IU] | Freq: Once | INTRAVENOUS | Status: AC | PRN
  Administered 2012-07-08: 500 [IU]
  Filled 2012-07-08: qty 5

## 2012-07-08 MED ORDER — DEXAMETHASONE SODIUM PHOSPHATE 4 MG/ML IJ SOLN
12.0000 mg | Freq: Once | INTRAMUSCULAR | Status: AC
  Administered 2012-07-08: 12 mg via INTRAVENOUS

## 2012-07-08 MED ORDER — VINCRISTINE SULFATE CHEMO INJECTION 1 MG/ML
1.5000 mg | Freq: Once | INTRAVENOUS | Status: AC
  Administered 2012-07-08: 1.5 mg via INTRAVENOUS
  Filled 2012-07-08: qty 1.5

## 2012-07-08 MED ORDER — DOXORUBICIN HCL CHEMO IV INJECTION 2 MG/ML
45.0000 mg/m2 | Freq: Once | INTRAVENOUS | Status: AC
  Administered 2012-07-08: 88 mg via INTRAVENOUS
  Filled 2012-07-08: qty 44

## 2012-07-08 MED ORDER — LORAZEPAM 2 MG/ML IJ SOLN
1.0000 mg | Freq: Once | INTRAMUSCULAR | Status: AC
  Administered 2012-07-08: 1 mg via INTRAVENOUS

## 2012-07-08 NOTE — Telephone Encounter (Signed)
Mailed schedule for 07/22/12 appointment and left a reminder message on the patient's voicemail.

## 2012-07-08 NOTE — Addendum Note (Signed)
Addended by: Arlan Organ R on: 07/08/2012 10:12 AM   Modules accepted: Orders, Medications
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
## 2012-07-08 NOTE — Progress Notes (Signed)
This office note has been dictated.

## 2012-07-22 ENCOUNTER — Ambulatory Visit (HOSPITAL_BASED_OUTPATIENT_CLINIC_OR_DEPARTMENT_OTHER): Payer: Non-veteran care

## 2012-07-22 ENCOUNTER — Ambulatory Visit (HOSPITAL_BASED_OUTPATIENT_CLINIC_OR_DEPARTMENT_OTHER)
Admission: RE | Admit: 2012-07-22 | Discharge: 2012-07-22 | Disposition: A | Discharge status: Home / Self Care | Payer: Non-veteran care | Source: Ambulatory Visit | Attending: Hematology & Oncology | Admitting: Hematology & Oncology

## 2012-07-22 ENCOUNTER — Other Ambulatory Visit: Payer: Self-pay | Admitting: *Deleted

## 2012-07-22 ENCOUNTER — Other Ambulatory Visit: Payer: Non-veteran care | Admitting: Lab

## 2012-07-22 ENCOUNTER — Ambulatory Visit (HOSPITAL_BASED_OUTPATIENT_CLINIC_OR_DEPARTMENT_OTHER): Payer: Non-veteran care | Admitting: Hematology & Oncology

## 2012-07-22 VITALS — BP 128/70 | HR 69 | Temp 97.2°F | Resp 18 | Ht 70.0 in | Wt 137.0 lb

## 2012-07-22 DIAGNOSIS — Z5111 Encounter for antineoplastic chemotherapy: Secondary | ICD-10-CM

## 2012-07-22 DIAGNOSIS — IMO0002 Reserved for concepts with insufficient information to code with codable children: Secondary | ICD-10-CM

## 2012-07-22 DIAGNOSIS — R109 Unspecified abdominal pain: Secondary | ICD-10-CM

## 2012-07-22 LAB — CBC WITH DIFFERENTIAL (CANCER CENTER ONLY)
BASO#: 0 10*3/uL (ref 0.0–0.2)
EOS%: 3.2 % (ref 0.0–7.0)
LYMPH%: 30.6 % (ref 14.0–48.0)
MCH: 30.6 pg (ref 28.0–33.4)
MCHC: 36.1 g/dL — ABNORMAL HIGH (ref 32.0–35.9)
MCV: 85 fL (ref 82–98)
MONO%: 20.2 % — ABNORMAL HIGH (ref 0.0–13.0)
NEUT#: 0.6 10*3/uL — ABNORMAL LOW (ref 1.5–6.5)
Platelets: 162 10*3/uL (ref 145–400)

## 2012-07-22 LAB — COMPREHENSIVE METABOLIC PANEL
AST: 17 U/L (ref 0–37)
Albumin: 4.8 g/dL (ref 3.5–5.2)
Alkaline Phosphatase: 71 U/L (ref 39–117)
BUN: 7 mg/dL (ref 6–23)
Potassium: 4.5 mEq/L (ref 3.5–5.3)
Sodium: 140 mEq/L (ref 135–145)
Total Bilirubin: 0.5 mg/dL (ref 0.3–1.2)

## 2012-07-22 MED ORDER — SODIUM CHLORIDE 0.9 % IJ SOLN
10.0000 mL | INTRAMUSCULAR | Status: DC | PRN
  Administered 2012-07-22: 10 mL
  Filled 2012-07-22: qty 10

## 2012-07-22 MED ORDER — DEXAMETHASONE SODIUM PHOSPHATE 4 MG/ML IJ SOLN
12.0000 mg | Freq: Once | INTRAMUSCULAR | Status: AC
  Administered 2012-07-22: 12 mg via INTRAVENOUS

## 2012-07-22 MED ORDER — VINCRISTINE SULFATE CHEMO INJECTION 1 MG/ML
1.0000 mg | Freq: Once | INTRAVENOUS | Status: AC
  Administered 2012-07-22: 1 mg via INTRAVENOUS
  Filled 2012-07-22: qty 1

## 2012-07-22 MED ORDER — FAMOTIDINE IN NACL 20-0.9 MG/50ML-% IV SOLN
40.0000 mg | Freq: Two times a day (BID) | INTRAVENOUS | Status: DC
  Administered 2012-07-22: 40 mg via INTRAVENOUS

## 2012-07-22 MED ORDER — ALPRAZOLAM 1 MG PO TABS: ORAL_TABLET | ORAL | Status: DC

## 2012-07-22 MED ORDER — DOXORUBICIN HCL CHEMO IV INJECTION 2 MG/ML
45.0000 mg/m2 | Freq: Once | INTRAVENOUS | Status: AC
  Administered 2012-07-22: 88 mg via INTRAVENOUS
  Filled 2012-07-22: qty 44

## 2012-07-22 MED ORDER — HYDROMORPHONE HCL PF 1 MG/ML IJ SOLN
2.0000 mg | Freq: Once | INTRAMUSCULAR | Status: AC
  Administered 2012-07-22: 2 mg via INTRAVENOUS

## 2012-07-22 MED ORDER — SODIUM CHLORIDE 0.9 % IV SOLN
675.0000 mg/m2 | Freq: Once | INTRAVENOUS | Status: AC
  Administered 2012-07-22: 1320 mg via INTRAVENOUS
  Filled 2012-07-22: qty 66

## 2012-07-22 MED ORDER — SODIUM CHLORIDE 0.9 % IV SOLN
Freq: Once | INTRAVENOUS | Status: AC
  Administered 2012-07-22: 12:00:00 via INTRAVENOUS

## 2012-07-22 MED ORDER — PALONOSETRON HCL INJECTION 0.25 MG/5ML
0.2500 mg | Freq: Once | INTRAVENOUS | Status: AC
  Administered 2012-07-22: 0.25 mg via INTRAVENOUS

## 2012-07-22 MED ORDER — HEPARIN SOD (PORK) LOCK FLUSH 100 UNIT/ML IV SOLN
500.0000 [IU] | Freq: Once | INTRAVENOUS | Status: AC | PRN
  Administered 2012-07-22: 500 [IU]
  Filled 2012-07-22: qty 5

## 2012-07-22 MED ORDER — SODIUM CHLORIDE 0.9 % IV SOLN
150.0000 mg | Freq: Once | INTRAVENOUS | Status: AC
  Administered 2012-07-22: 150 mg via INTRAVENOUS
  Filled 2012-07-22: qty 5

## 2012-07-22 NOTE — Progress Notes (Signed)
This office note has been dictated.

## 2012-07-22 NOTE — Progress Notes (Signed)
DIAGNOSIS:  Anaplastic T-cell lymphoma.  CURRENT THERAPY:  The patient is status post 6 cycles of CHOP.  INTERIM HISTORY:  Andrew Dalton comes in for his followup.  We are trying to do his last 2 cycles of chemotherapy every 2 weeks.  He began to have some abdominal pain a couple of days ago.  We got some x-rays on him today.  There is no obstruction.  He has a little bit of dilated loops of small bowel.  Some small air-fluid levels were noted.  There is no obvious obstruction.  There is no perforation.  This abdominal pain is not associated with any radiation.  It is in his upper abdomen.  He is not on any type of antacids.  We may want to consider him for some Pepcid.  There is no diarrhea.  He is not constipated.  He is eating okay.  He has had no cough or shortness of breath.  He has had no rashes.  There has been no leg swelling.  PHYSICAL EXAMINATION:  This is a well-developed, well-nourished white gentleman in no obvious distress.  Vital Signs:  97.2, pulse 69, respiratory rate 18, blood pressure 128/70.  Weight is 137.  Head and neck:  Normocephalic, atraumatic skull.  There are no ocular or oral lesions.  There are no palpable cervical or supraclavicular lymph nodes. Lungs:  Clear bilaterally.  Cardiac:  Regular rate and rhythm with a normal S1 and S2.  There are no murmurs, rubs or bruits.  Abdomen:  Soft with good bowel sounds.  There is no palpable abdominal mass.  There is no palpable hepatosplenomegaly.  There is no tenderness to palpation in the abdomen.  Back:  No tenderness over the spine, ribs or hips. Extremities:  No clubbing, cyanosis or edema.  Neurologic:  No focal neurological deficits.  Skin:  No rashes, ecchymoses or petechia.  LABORATORY STUDIES:  White cell count is 1.2, hemoglobin 14, hematocrit 39.3, platelet count 162.  His white cell differential shows 45 segs, 31 lymphs, 20 monos.  IMPRESSION:  Andrew Dalton is a 21 year old gentleman with anaplastic  large- cell T-cell lymphoma.  His last PET scan, which was done in the [place] system, did not show any evidence of lymphoma.  I think this abdominal pain might be a localized ileus.  This could be from his vincristine.  As such, I will cut his dose back to 1 mg.  I think we can go ahead and treat him today despite his neutropenia. However, I think we are going to have to get him on Neulasta after this chemotherapy so that is white cell count will be adequate for his last cycle in 2 weeks.  We will see Andrew Dalton back in 2 weeks.   ______________________________ Josph Macho, M.D. PRE/MEDQ  D:  07/22/2012  T:  07/22/2012  Job:  [id number]

## 2012-07-22 NOTE — Patient Instructions (Addendum)
[facility] Discharge Instructions for Patients Receiving Chemotherapy  Today you received the following chemotherapy agents Adriamycin, Vincristine, Cytoxan. To take Prednisone at home starting the day of chemotherapy and continue for 5 days.   To help prevent nausea and vomiting after your treatment, we encourage you to take your nausea medication:  Zofran 8mg  every 12 hours starting 2 days after chemo for 2 days then every 12 hours as needed for nausea or vomiting  Phenergan 12.5 mg or (1/2 of a 25 mg tab) every 6 hours as needed for nausea. Can take immediately after chemo.  EMLA Cream Apply quarter size application to Port site 1 hour prior to chemotherapy. Do NOT rub in. Cover area with plastic wrap.   If you develop nausea and vomiting that is not controlled by your nausea medication, call the clinic. If it is after clinic hours your family physician or the after hours number for the clinic or go to the Emergency Department.   BELOW ARE SYMPTOMS THAT SHOULD BE REPORTED IMMEDIATELY:  *FEVER GREATER THAN 100.5 F  *CHILLS WITH OR WITHOUT FEVER  NAUSEA AND VOMITING THAT IS NOT CONTROLLED WITH YOUR NAUSEA MEDICATION  *UNUSUAL SHORTNESS OF BREATH  *UNUSUAL BRUISING OR BLEEDING  TENDERNESS IN MOUTH AND THROAT WITH OR WITHOUT PRESENCE OF ULCERS  *URINARY PROBLEMS  *BOWEL PROBLEMS  UNUSUAL RASH Items with * indicate a potential emergency and should be followed up as soon as possible.  One of the nurses will contact you 24 hours after your treatment. Please let the nurse know about any problems that you may have experienced. Feel free to call the clinic you have any questions or concerns. The clinic phone number is [phone].   I have been informed and understand all the instructions given to me. I know to contact the clinic, my physician, or go to the Emergency Department if any problems should occur. I do not have any questions at this time, but understand  that I may call the clinic during office hours   should I have any questions or need assistance in obtaining follow up care.    __________________________________________          _____________     __________ Signature of Patient                                                                     Date                   Time    __________________________________________ Nurse's Signature

## 2012-07-23 ENCOUNTER — Ambulatory Visit (HOSPITAL_BASED_OUTPATIENT_CLINIC_OR_DEPARTMENT_OTHER): Payer: Non-veteran care

## 2012-07-23 DIAGNOSIS — IMO0002 Reserved for concepts with insufficient information to code with codable children: Secondary | ICD-10-CM

## 2012-07-23 DIAGNOSIS — Z5189 Encounter for other specified aftercare: Secondary | ICD-10-CM

## 2012-07-23 MED ORDER — PEGFILGRASTIM INJECTION 6 MG/0.6ML
6.0000 mg | Freq: Once | SUBCUTANEOUS | Status: AC
  Administered 2012-07-23: 6 mg via SUBCUTANEOUS

## 2012-07-23 NOTE — Patient Instructions (Signed)

## 2012-08-05 ENCOUNTER — Ambulatory Visit (HOSPITAL_BASED_OUTPATIENT_CLINIC_OR_DEPARTMENT_OTHER): Payer: Non-veteran care | Admitting: Hematology & Oncology

## 2012-08-05 ENCOUNTER — Other Ambulatory Visit (HOSPITAL_BASED_OUTPATIENT_CLINIC_OR_DEPARTMENT_OTHER): Payer: Non-veteran care | Admitting: Lab

## 2012-08-05 ENCOUNTER — Other Ambulatory Visit: Payer: Self-pay | Admitting: *Deleted

## 2012-08-05 ENCOUNTER — Ambulatory Visit (HOSPITAL_BASED_OUTPATIENT_CLINIC_OR_DEPARTMENT_OTHER): Payer: Non-veteran care

## 2012-08-05 ENCOUNTER — Telehealth: Payer: Self-pay | Admitting: Hematology & Oncology

## 2012-08-05 VITALS — BP 120/60 | HR 66 | Temp 97.9°F | Resp 18 | Ht 70.0 in | Wt 143.0 lb

## 2012-08-05 DIAGNOSIS — Z5111 Encounter for antineoplastic chemotherapy: Secondary | ICD-10-CM

## 2012-08-05 DIAGNOSIS — IMO0002 Reserved for concepts with insufficient information to code with codable children: Secondary | ICD-10-CM

## 2012-08-05 DIAGNOSIS — F901 Attention-deficit hyperactivity disorder, predominantly hyperactive type: Secondary | ICD-10-CM

## 2012-08-05 DIAGNOSIS — C8590 Non-Hodgkin lymphoma, unspecified, unspecified site: Secondary | ICD-10-CM

## 2012-08-05 LAB — COMPREHENSIVE METABOLIC PANEL
Alkaline Phosphatase: 86 U/L (ref 39–117)
CO2: 29 mEq/L (ref 19–32)
Creatinine, Ser: 0.84 mg/dL (ref 0.50–1.35)
Glucose, Bld: 94 mg/dL (ref 70–99)
Total Bilirubin: 0.3 mg/dL (ref 0.3–1.2)

## 2012-08-05 LAB — MANUAL DIFFERENTIAL (CHCC SATELLITE)
ALC: 1.4 10*3/uL (ref 0.9–3.3)
ANC (CHCC HP manual diff): 10.1 10*3/uL — ABNORMAL HIGH (ref 1.5–6.5)
LYMPH: 11 % — ABNORMAL LOW (ref 14–48)
MONO: 9 % (ref 0–13)
SEG: 79 % — ABNORMAL HIGH (ref 40–75)

## 2012-08-05 LAB — CBC WITH DIFFERENTIAL (CANCER CENTER ONLY)
HCT: 35.2 % — ABNORMAL LOW (ref 38.7–49.9)
HGB: 12.5 g/dL — ABNORMAL LOW (ref 13.0–17.1)
MCH: 31.1 pg (ref 28.0–33.4)
MCHC: 35.5 g/dL (ref 32.0–35.9)
MCV: 88 fL (ref 82–98)
RDW: 15.3 % (ref 11.1–15.7)

## 2012-08-05 LAB — LACTATE DEHYDROGENASE: LDH: 204 U/L (ref 94–250)

## 2012-08-05 MED ORDER — PALONOSETRON HCL INJECTION 0.25 MG/5ML
0.2500 mg | Freq: Once | INTRAVENOUS | Status: AC
  Administered 2012-08-05: 0.25 mg via INTRAVENOUS

## 2012-08-05 MED ORDER — DEXAMETHASONE SODIUM PHOSPHATE 4 MG/ML IJ SOLN
12.0000 mg | Freq: Once | INTRAMUSCULAR | Status: AC
  Administered 2012-08-05: 20 mg via INTRAVENOUS

## 2012-08-05 MED ORDER — AMPHETAMINE-DEXTROAMPHETAMINE 20 MG PO TABS: 20.0000 mg | ORAL_TABLET | Freq: Every day | ORAL | Status: DC

## 2012-08-05 MED ORDER — DOXORUBICIN HCL CHEMO IV INJECTION 2 MG/ML
45.0000 mg/m2 | Freq: Once | INTRAVENOUS | Status: AC
  Administered 2012-08-05: 88 mg via INTRAVENOUS
  Filled 2012-08-05: qty 44

## 2012-08-05 MED ORDER — VINCRISTINE SULFATE CHEMO INJECTION 1 MG/ML
1.0000 mg | Freq: Once | INTRAVENOUS | Status: AC
  Administered 2012-08-05: 1 mg via INTRAVENOUS
  Filled 2012-08-05: qty 1

## 2012-08-05 MED ORDER — HEPARIN SOD (PORK) LOCK FLUSH 100 UNIT/ML IV SOLN
500.0000 [IU] | Freq: Once | INTRAVENOUS | Status: AC | PRN
  Administered 2012-08-05: 500 [IU]
  Filled 2012-08-05: qty 5

## 2012-08-05 MED ORDER — SODIUM CHLORIDE 0.9 % IV SOLN
675.0000 mg/m2 | Freq: Once | INTRAVENOUS | Status: AC
  Administered 2012-08-05: 1320 mg via INTRAVENOUS
  Filled 2012-08-05: qty 66

## 2012-08-05 MED ORDER — SODIUM CHLORIDE 0.9 % IV SOLN
Freq: Once | INTRAVENOUS | Status: AC
  Administered 2012-08-05: 11:00:00 via INTRAVENOUS

## 2012-08-05 MED ORDER — SODIUM CHLORIDE 0.9 % IJ SOLN
10.0000 mL | INTRAMUSCULAR | Status: DC | PRN
  Administered 2012-08-05: 10 mL
  Filled 2012-08-05: qty 10

## 2012-08-05 MED ORDER — SODIUM CHLORIDE 0.9 % IV SOLN
150.0000 mg | Freq: Once | INTRAVENOUS | Status: AC
  Administered 2012-08-05: 150 mg via INTRAVENOUS
  Filled 2012-08-05: qty 5

## 2012-08-05 NOTE — Progress Notes (Signed)
This office note has been dictated.

## 2012-08-05 NOTE — Telephone Encounter (Signed)
Faxed request for PET at [facility] the 1st week of October and MD and labs 09-26-12

## 2012-08-05 NOTE — Patient Instructions (Signed)
[facility] Discharge Instructions for Patients Receiving Chemotherapy  Today you received the following chemotherapy agents Adriamycin, Vincristine, Cytoxan. To take Prednisone at home starting the day of chemotherapy and continue for 5 days.   To help prevent nausea and vomiting after your treatment, we encourage you to take your nausea medication:  Zofran 8mg  every 12 hours starting 2 days after chemo for 2 days then every 12 hours as needed for nausea or vomiting  Phenergan 12.5 mg or (1/2 of a 25 mg tab) every 6 hours as needed for nausea. Can take immediately after chemo.  EMLA Cream Apply quarter size application to Port site 1 hour prior to chemotherapy. Do NOT rub in. Cover area with plastic wrap.   If you develop nausea and vomiting that is not controlled by your nausea medication, call the clinic. If it is after clinic hours your family physician or the after hours number for the clinic or go to the Emergency Department.   BELOW ARE SYMPTOMS THAT SHOULD BE REPORTED IMMEDIATELY:  *FEVER GREATER THAN 100.5 F  *CHILLS WITH OR WITHOUT FEVER  NAUSEA AND VOMITING THAT IS NOT CONTROLLED WITH YOUR NAUSEA MEDICATION  *UNUSUAL SHORTNESS OF BREATH  *UNUSUAL BRUISING OR BLEEDING  TENDERNESS IN MOUTH AND THROAT WITH OR WITHOUT PRESENCE OF ULCERS  *URINARY PROBLEMS  *BOWEL PROBLEMS  UNUSUAL RASH Items with * indicate a potential emergency and should be followed up as soon as possible.  One of the nurses will contact you 24 hours after your treatment. Please let the nurse know about any problems that you may have experienced. Feel free to call the clinic you have any questions or concerns. The clinic phone number is [phone].   I have been informed and understand all the instructions given to me. I know to contact the clinic, my physician, or go to the Emergency Department if any problems should occur. I do not have any questions at this time, but understand  that I may call the clinic during office hours   should I have any questions or need assistance in obtaining follow up care.    __________________________________________          _____________     __________ Signature of Patient                                                                     Date                   Time    __________________________________________ Nurse's Signature  Vincristine injection  What is this medicine? VINCRISTINE (vin KRIS teen) is a chemotherapy drug. It slows the growth of cancer cells. This medicine is used to treat many types of cancer like Hodgkin's disease, leukemia, non-Hodgkin's lymphoma, neuroblastoma (brain cancer), rhabdomyosarcoma, and Wilms' tumor. This medicine may be used for other purposes; ask your health care provider or pharmacist if you have questions. What should I tell my health care provider before I take this medicine? They need to know if you have any of these conditions: -blood disorders -gout -infection (especially chickenpox, cold sores, or herpes) -kidney disease -liver disease -lung disease -nervous system disease like Charcot-Marie-Tooth (CMT) -recent or ongoing radiation therapy -an unusual or allergic reaction  to vincristine, other chemotherapy agents, other medicines, foods, dyes, or preservatives -pregnant or trying to get pregnant -breast-feeding How should I use this medicine? This drug is given as an infusion into a vein. It is administered in a hospital or clinic by a specially trained health care professional. If you have pain, swelling, burning, or any unusual feeling around the site of your injection, tell your health care professional right away. Talk to your pediatrician regarding the use of this medicine in children. While this drug may be prescribed for selected conditions, precautions do apply. Overdosage: If you think you have taken too much of this medicine contact a poison control center or emergency  room at once. NOTE: This medicine is only for you. Do not share this medicine with others. What if I miss a dose? It is important not to miss your dose. Call your doctor or health care professional if you are unable to keep an appointment. What may interact with this medicine? Do not take this medicine with any of the following medications: -itraconazole -mibefradil -voriconazole This medicine may also interact with the following medications: -cyclosporine -erythromycin -fluconazole -ketoconazole -medicines for HIV like delavirdine, efavirenz, nevirapine -medicines for seizures like ethotoin, fosphenotoin, phenytoin -medicines to increase blood counts like filgrastim, pegfilgrastim, sargramostim -other chemotherapy drugs like cisplatin, L-asparaginase, methotrexate, mitomycin, paclitaxel -pegaspargase -vaccines -zalcitabine, ddC Talk to your doctor or health care professional before taking any of these medicines: -acetaminophen -aspirin -ibuprofen -ketoprofen -naproxen This list may not describe all possible interactions. Give your health care provider a list of all the medicines, herbs, non-prescription drugs, or dietary supplements you use. Also tell them if you smoke, drink alcohol, or use illegal drugs. Some items may interact with your medicine. What should I watch for while using this medicine? Your condition will be monitored carefully while you are receiving this medicine. You will need important blood work done while you are taking this medicine. This drug may make you feel generally unwell. This is not uncommon, as chemotherapy can affect healthy cells as well as cancer cells. Report any side effects. Continue your course of treatment even though you feel ill unless your doctor tells you to stop. In some cases, you may be given additional medicines to help with side effects. Follow all directions for their use. Call your doctor or health care professional for advice if you  get a fever, chills or sore throat, or other symptoms of a cold or flu. Do not treat yourself. Avoid taking products that contain aspirin, acetaminophen, ibuprofen, naproxen, or ketoprofen unless instructed by your doctor. These medicines may hide a fever. Do not become pregnant while taking this medicine. Women should inform their doctor if they wish to become pregnant or think they might be pregnant. There is a potential for serious side effects to an unborn child. Talk to your health care professional or pharmacist for more information. Do not breast-feed an infant while taking this medicine. Men may have a lower sperm count while taking this medicine. Talk to your doctor if you plan to father a child. What side effects may I notice from receiving this medicine? Side effects that you should report to your doctor or health care professional as soon as possible: -allergic reactions like skin rash, itching or hives, swelling of the face, lips, or tongue -breathing problems -confusion or changes in emotions or moods -constipation -cough -mouth sores -muscle weakness -nausea and vomiting -pain, swelling, redness or irritation at the injection site -pain, tingling, numbness in the  hands or feet -problems with balance, talking, walking -seizures -stomach pain -trouble passing urine or change in the amount of urine Side effects that usually do not require medical attention (report to your doctor or health care professional if they continue or are bothersome): -diarrhea -hair loss -jaw pain -loss of appetite This list may not describe all possible side effects. Call your doctor for medical advice about side effects. You may report side effects to FDA at 1-800-FDA-1088. Where should I keep my medicine? This drug is given in a hospital or clinic and will not be stored at home. NOTE: This sheet is a summary. It may not cover all possible information. If you have questions about this medicine, talk  to your doctor, pharmacist, or health care provider.  2012, [employer] Standard. (08/23/2008 5:17:13 PM)  Doxorubicin injection  What is this medicine? DOXORUBICIN (dox oh ROO bi sin) is a chemotherapy drug. It is used to treat many kinds of cancer like Hodgkin's disease, leukemia, non-Hodgkin's lymphoma, neuroblastoma, sarcoma, and Wilms' tumor. It is also used to treat bladder cancer, breast cancer, lung cancer, ovarian cancer, stomach cancer, and thyroid cancer. This medicine may be used for other purposes; ask your health care provider or pharmacist if you have questions. What should I tell my health care provider before I take this medicine? They need to know if you have any of these conditions: -blood disorders -heart disease, recent heart attack -infection (especially a virus infection such as chickenpox, cold sores, or herpes) -irregular heartbeat -liver disease -recent or ongoing radiation therapy -an unusual or allergic reaction to doxorubicin, other chemotherapy agents, other medicines, foods, dyes, or preservatives -pregnant or trying to get pregnant -breast-feeding How should I use this medicine? This drug is given as an infusion into a vein. It is administered in a hospital or clinic by a specially trained health care professional. If you have pain, swelling, burning or any unusual feeling around the site of your injection, tell your health care professional right away. Talk to your pediatrician regarding the use of this medicine in children. Special care may be needed. Overdosage: If you think you have taken too much of this medicine contact a poison control center or emergency room at once. NOTE: This medicine is only for you. Do not share this medicine with others. What if I miss a dose? It is important not to miss your dose. Call your doctor or health care professional if you are unable to keep an appointment. What may interact with this medicine? Do not take this  medicine with any of the following medications: -cisapride -droperidol -halofantrine -pimozide -zidovudine This medicine may also interact with the following medications: -chloroquine -chlorpromazine -clarithromycin -cyclophosphamide -cyclosporine -erythromycin -medicines for depression, anxiety, or psychotic disturbances -medicines for irregular heart beat like amiodarone, bepridil, dofetilide, encainide, flecainide, propafenone, quinidine -medicines for seizures like ethotoin, fosphenytoin, phenytoin -medicines for nausea, vomiting like dolasetron, ondansetron, palonosetron -medicines to increase blood counts like filgrastim, pegfilgrastim, sargramostim -methadone -methotrexate -pentamidine -progesterone -vaccines -verapamil Talk to your doctor or health care professional before taking any of these medicines: -acetaminophen -aspirin -ibuprofen -ketoprofen -naproxen This list may not describe all possible interactions. Give your health care provider a list of all the medicines, herbs, non-prescription drugs, or dietary supplements you use. Also tell them if you smoke, drink alcohol, or use illegal drugs. Some items may interact with your medicine. What should I watch for while using this medicine? Your condition will be monitored carefully while you are receiving this medicine. You  will need important blood work done while you are taking this medicine. This drug may make you feel generally unwell. This is not uncommon, as chemotherapy can affect healthy cells as well as cancer cells. Report any side effects. Continue your course of treatment even though you feel ill unless your doctor tells you to stop. Your urine may turn red for a few days after your dose. This is not blood. If your urine is dark or brown, call your doctor. In some cases, you may be given additional medicines to help with side effects. Follow all directions for their use. Call your doctor or health care  professional for advice if you get a fever, chills or sore throat, or other symptoms of a cold or flu. Do not treat yourself. This drug decreases your body's ability to fight infections. Try to avoid being around people who are sick. This medicine may increase your risk to bruise or bleed. Call your doctor or health care professional if you notice any unusual bleeding. Be careful brushing and flossing your teeth or using a toothpick because you may get an infection or bleed more easily. If you have any dental work done, tell your dentist you are receiving this medicine. Avoid taking products that contain aspirin, acetaminophen, ibuprofen, naproxen, or ketoprofen unless instructed by your doctor. These medicines may hide a fever. Men and women of childbearing age should use effective birth control methods while using taking this medicine. Do not become pregnant while taking this medicine. There is a potential for serious side effects to an unborn child. Talk to your health care professional or pharmacist for more information. Do not breast-feed an infant while taking this medicine. Do not let others touch your urine or other body fluids for 5 days after each treatment with this medicine. Caregivers should wear latex gloves to avoid touching body fluids during this time. What side effects may I notice from receiving this medicine? Side effects that you should report to your doctor or health care professional as soon as possible: -allergic reactions like skin rash, itching or hives, swelling of the face, lips, or tongue -low blood counts - this medicine may decrease the number of white blood cells, red blood cells and platelets. You may be at increased risk for infections and bleeding. -signs of infection - fever or chills, cough, sore throat, pain or difficulty passing urine -signs of decreased platelets or bleeding - bruising, pinpoint red spots on the skin, black, tarry stools, blood in the urine -signs  of decreased red blood cells - unusually weak or tired, fainting spells, lightheadedness -breathing problems -chest pain -fast, irregular heartbeat -mouth sores -nausea, vomiting -pain, swelling, redness at site where injected -pain, tingling, numbness in the hands or feet -swelling of ankles, feet, or hands -unusual bleeding or bruising Side effects that usually do not require medical attention (report to your doctor or health care professional if they continue or are bothersome): -diarrhea -facial flushing -hair loss -loss of appetite -missed menstrual periods -nail discoloration or damage -red or watery eyes -red colored urine -stomach upset This list may not describe all possible side effects. Call your doctor for medical advice about side effects. You may report side effects to FDA at 1-800-FDA-1088. Where should I keep my medicine? This drug is given in a hospital or clinic and will not be stored at home. NOTE: This sheet is a summary. It may not cover all possible information. If you have questions about this medicine, talk to your doctor,  pharmacist, or health care provider.  2012, [employer] Standard. (03/16/2008 5:07:32 PM)   Vincristine injection  What is this medicine? VINCRISTINE (vin KRIS teen) is a chemotherapy drug. It slows the growth of cancer cells. This medicine is used to treat many types of cancer like Hodgkin's disease, leukemia, non-Hodgkin's lymphoma, neuroblastoma (brain cancer), rhabdomyosarcoma, and Wilms' tumor. This medicine may be used for other purposes; ask your health care provider or pharmacist if you have questions. What should I tell my health care provider before I take this medicine? They need to know if you have any of these conditions: -blood disorders -gout -infection (especially chickenpox, cold sores, or herpes) -kidney disease -liver disease -lung disease -nervous system disease like Charcot-Marie-Tooth (CMT) -recent or ongoing  radiation therapy -an unusual or allergic reaction to vincristine, other chemotherapy agents, other medicines, foods, dyes, or preservatives -pregnant or trying to get pregnant -breast-feeding How should I use this medicine? This drug is given as an infusion into a vein. It is administered in a hospital or clinic by a specially trained health care professional. If you have pain, swelling, burning, or any unusual feeling around the site of your injection, tell your health care professional right away. Talk to your pediatrician regarding the use of this medicine in children. While this drug may be prescribed for selected conditions, precautions do apply. Overdosage: If you think you have taken too much of this medicine contact a poison control center or emergency room at once. NOTE: This medicine is only for you. Do not share this medicine with others. What if I miss a dose? It is important not to miss your dose. Call your doctor or health care professional if you are unable to keep an appointment. What may interact with this medicine? Do not take this medicine with any of the following medications: -itraconazole -mibefradil -voriconazole This medicine may also interact with the following medications: -cyclosporine -erythromycin -fluconazole -ketoconazole -medicines for HIV like delavirdine, efavirenz, nevirapine -medicines for seizures like ethotoin, fosphenotoin, phenytoin -medicines to increase blood counts like filgrastim, pegfilgrastim, sargramostim -other chemotherapy drugs like cisplatin, L-asparaginase, methotrexate, mitomycin, paclitaxel -pegaspargase -vaccines -zalcitabine, ddC Talk to your doctor or health care professional before taking any of these medicines: -acetaminophen -aspirin -ibuprofen -ketoprofen -naproxen This list may not describe all possible interactions. Give your health care provider a list of all the medicines, herbs, non-prescription drugs, or dietary  supplements you use. Also tell them if you smoke, drink alcohol, or use illegal drugs. Some items may interact with your medicine. What should I watch for while using this medicine? Your condition will be monitored carefully while you are receiving this medicine. You will need important blood work done while you are taking this medicine. This drug may make you feel generally unwell. This is not uncommon, as chemotherapy can affect healthy cells as well as cancer cells. Report any side effects. Continue your course of treatment even though you feel ill unless your doctor tells you to stop. In some cases, you may be given additional medicines to help with side effects. Follow all directions for their use. Call your doctor or health care professional for advice if you get a fever, chills or sore throat, or other symptoms of a cold or flu. Do not treat yourself. Avoid taking products that contain aspirin, acetaminophen, ibuprofen, naproxen, or ketoprofen unless instructed by your doctor. These medicines may hide a fever. Do not become pregnant while taking this medicine. Women should inform their doctor if they wish to become pregnant  or think they might be pregnant. There is a potential for serious side effects to an unborn child. Talk to your health care professional or pharmacist for more information. Do not breast-feed an infant while taking this medicine. Men may have a lower sperm count while taking this medicine. Talk to your doctor if you plan to father a child. What side effects may I notice from receiving this medicine? Side effects that you should report to your doctor or health care professional as soon as possible: -allergic reactions like skin rash, itching or hives, swelling of the face, lips, or tongue -breathing problems -confusion or changes in emotions or moods -constipation -cough -mouth sores -muscle weakness -nausea and vomiting -pain, swelling, redness or irritation at the  injection site -pain, tingling, numbness in the hands or feet -problems with balance, talking, walking -seizures -stomach pain -trouble passing urine or change in the amount of urine Side effects that usually do not require medical attention (report to your doctor or health care professional if they continue or are bothersome): -diarrhea -hair loss -jaw pain -loss of appetite This list may not describe all possible side effects. Call your doctor for medical advice about side effects. You may report side effects to FDA at 1-800-FDA-1088. Where should I keep my medicine? This drug is given in a hospital or clinic and will not be stored at home. NOTE: This sheet is a summary. It may not cover all possible information. If you have questions about this medicine, talk to your doctor, pharmacist, or health care provider.  2012, [employer] Standard. (08/23/2008 5:17:13 PM)

## 2012-08-05 NOTE — Telephone Encounter (Signed)
While here for a f/u and tx appt the pt asked for an adderal refill. Routed to Dr Myna Hidalgo for approval.

## 2012-08-06 NOTE — Progress Notes (Signed)
CC:   Jana Hakim, MD, Fax [phone]  DIAGNOSIS:  Anaplastic large cell lymphoma (T-cell).  CURRENT THERAPY: 1. The patient to complete 8 cycles of CHOP today. 2. Neulasta 6 mg subcu for white cell support.  INTERIM HISTORY:  Mr. Wenig comes in for his final cycle of chemo.  He got Neulasta last time.  This did cause quite a bit of arthralgias and myalgias.  However, it worked very nicely with his white cell count being up. He is feeling well.  He is looking forward to finishing his treatments.  He has had apparently he has had no nausea or vomiting.  He has had no cough.  He has had no obvious, no palpable lymph glands.  He has had no change in bowel or bladder habits.  There have been no rashes.  He has had no fever.  PHYSICAL EXAMINATION:  General:  This is a well-developed, well- nourished white gentleman in no obvious distress.  Vital signs:  97.6, pulse 66, respiratory rate 18, blood pressure 120/60.  Weight is 143. Head and neck:  Shows a normocephalic, atraumatic skull.  There are no ocular or oral lesions.  There are no palpable cervical or supraclavicular lymph nodes.  Lungs:  Clear bilaterally.  Cardiac: Regular rate and rhythm with a normal S1, S2.  There are no murmurs, rubs or bruits.  Abdomen:  Soft with good bowel sounds.  There is no palpable abdominal mass.  There is no palpable hepatosplenomegaly. Axillary:  Exam shows no bilateral axillary adenopathy.  Extremities: Show no clubbing, cyanosis or edema.  Skin:  Exam shows no rash, ecchymosis or petechia.  LABORATORY STUDIES:  White cell count is 12.6, hemoglobin 12.5, hematocrit 35.2, platelet count 161.  IMPRESSION:  Mr. Vanauken is a 21 year old gentleman with anaplastic large cell lymphoma.  He is in remission.  His last PET scan looked great. This was done I think back in June or July.  We will go ahead and finish up with his 8th cycle treatments.  We will then plan for end of treatment PET  scan.  Mr. Swamy clearly has an aggressive lymphoma.  I think we are going to have to watch this closely.  We will plan to get a PET scan on him in October.  We will get him back afterwards.  He will be going to [place] in October.  I think we will get a PET scan before he goes to [place] and then see him back afterwards.   ______________________________ Josph Macho, M.D. PRE/MEDQ  D:  08/05/2012  T:  08/05/2012  Job:  [id number]

## 2012-08-13 ENCOUNTER — Other Ambulatory Visit: Payer: Self-pay | Admitting: *Deleted

## 2012-08-13 ENCOUNTER — Ambulatory Visit (HOSPITAL_BASED_OUTPATIENT_CLINIC_OR_DEPARTMENT_OTHER): Payer: Non-veteran care

## 2012-08-13 VITALS — BP 119/76 | HR 89 | Resp 20

## 2012-08-13 DIAGNOSIS — C8590 Non-Hodgkin lymphoma, unspecified, unspecified site: Secondary | ICD-10-CM

## 2012-08-13 DIAGNOSIS — IMO0001 Reserved for inherently not codable concepts without codable children: Secondary | ICD-10-CM

## 2012-08-13 DIAGNOSIS — IMO0002 Reserved for concepts with insufficient information to code with codable children: Secondary | ICD-10-CM

## 2012-08-13 MED ORDER — HYDROMORPHONE HCL PF 4 MG/ML IJ SOLN: 2.0000 mg | Freq: Once | INTRAMUSCULAR | Status: DC

## 2012-08-13 MED ORDER — SODIUM CHLORIDE 0.9 % IV SOLN
Freq: Once | INTRAVENOUS | Status: AC
  Administered 2012-08-13: 11:00:00 via INTRAVENOUS

## 2012-08-13 MED ORDER — PROMETHAZINE HCL 25 MG/ML IJ SOLN: 25.0000 mg | Freq: Once | INTRAMUSCULAR | Status: DC

## 2012-08-13 MED ORDER — HYDROMORPHONE HCL PF 4 MG/ML IJ SOLN
2.0000 mg | Freq: Once | INTRAMUSCULAR | Status: AC
  Administered 2012-08-13: 2 mg via INTRAVENOUS

## 2012-08-13 MED ORDER — PROMETHAZINE HCL 25 MG/ML IJ SOLN
25.0000 mg | Freq: Once | INTRAMUSCULAR | Status: AC
  Administered 2012-08-13: 25 mg via INTRAVENOUS

## 2012-08-13 NOTE — Patient Instructions (Signed)
Dehydration, Adult Dehydration is when you lose more fluids from the body than you take in. Vital organs like the kidneys, brain, and heart cannot function without a proper amount of fluids and salt. Any loss of fluids from the body can cause dehydration.  CAUSES   Vomiting.   Diarrhea.   Excessive sweating.   Excessive urine output.   Fever.  SYMPTOMS  Mild dehydration  Thirst.   Dry lips.   Slightly dry mouth.  Moderate dehydration  Very dry mouth.   Sunken eyes.   Skin does not bounce back quickly when lightly pinched and released.   Dark urine and decreased urine production.   Decreased tear production.   Headache.  Severe dehydration  Very dry mouth.   Extreme thirst.   Rapid, weak pulse (more than 100 beats per minute at rest).   Cold hands and feet.   Not able to sweat in spite of heat and temperature.   Rapid breathing.   Blue lips.   Confusion and lethargy.   Difficulty being awakened.   Minimal urine production.   No tears.  DIAGNOSIS  Your caregiver will diagnose dehydration based on your symptoms and your exam. Blood and urine tests will help confirm the diagnosis. The diagnostic evaluation should also identify the cause of dehydration. TREATMENT  Treatment of mild or moderate dehydration can often be done at home by increasing the amount of fluids that you drink. It is best to drink small amounts of fluid more often. Drinking too much at one time can make vomiting worse. Refer to the home care instructions below. Severe dehydration needs to be treated at the hospital where you will probably be given intravenous (IV) fluids that contain water and electrolytes. HOME CARE INSTRUCTIONS   Ask your caregiver about specific rehydration instructions.   Drink enough fluids to keep your urine clear or pale yellow.   Drink small amounts frequently if you have nausea and vomiting.   Eat as you normally do.   Avoid:   Foods or drinks high in  sugar.   Carbonated drinks.   Juice.   Extremely hot or cold fluids.   Drinks with caffeine.   Fatty, greasy foods.   Alcohol.   Tobacco.   Overeating.   Gelatin desserts.   Wash your hands well to avoid spreading bacteria and viruses.   Only take over-the-counter or prescription medicines for pain, discomfort, or fever as directed by your caregiver.   Ask your caregiver if you should continue all prescribed and over-the-counter medicines.   Keep all follow-up appointments with your caregiver.  SEEK MEDICAL CARE IF:  You have abdominal pain and it increases or stays in one area (localizes).   You have a rash, stiff neck, or severe headache.   You are irritable, sleepy, or difficult to awaken.   You are weak, dizzy, or extremely thirsty.  SEEK IMMEDIATE MEDICAL CARE IF:   You are unable to keep fluids down or you get worse despite treatment.   You have frequent episodes of vomiting or diarrhea.   You have blood or green matter (bile) in your vomit.   You have blood in your stool or your stool looks black and tarry.   You have not urinated in 6 to 8 hours, or you have only urinated a small amount of very dark urine.   You have a fever.   You faint.  MAKE SURE YOU:   Understand these instructions.   Will watch your condition.     Will get help right away if you are not doing well or get worse.  Document Released: 11/26/2005 Document Revised: 11/15/2011 Document Reviewed: 07/16/2011 [employer] Patient Information 2012 [employer]. 

## 2012-08-27 ENCOUNTER — Other Ambulatory Visit: Payer: Self-pay | Admitting: *Deleted

## 2012-08-27 DIAGNOSIS — IMO0002 Reserved for concepts with insufficient information to code with codable children: Secondary | ICD-10-CM

## 2012-08-27 DIAGNOSIS — C8590 Non-Hodgkin lymphoma, unspecified, unspecified site: Secondary | ICD-10-CM

## 2012-08-27 DIAGNOSIS — F901 Attention-deficit hyperactivity disorder, predominantly hyperactive type: Secondary | ICD-10-CM

## 2012-08-27 MED ORDER — AMPHETAMINE-DEXTROAMPHETAMINE 20 MG PO TABS: 20.0000 mg | ORAL_TABLET | Freq: Every day | ORAL | Status: DC

## 2012-08-27 NOTE — Telephone Encounter (Signed)
Pt called asking if he could stop by to pick up his adderal rx as he was going to be in the area. Last rx was given on 9/27. Reviewed with Dr Myna Hidalgo. Ok to refill but won't be able to refill until the on or after the 24th. He verbalized understanding.

## 2012-09-09 ENCOUNTER — Other Ambulatory Visit: Payer: Self-pay | Admitting: *Deleted

## 2012-09-09 ENCOUNTER — Other Ambulatory Visit: Payer: Self-pay | Admitting: Hematology & Oncology

## 2012-09-09 DIAGNOSIS — C8590 Non-Hodgkin lymphoma, unspecified, unspecified site: Secondary | ICD-10-CM

## 2012-09-09 DIAGNOSIS — F901 Attention-deficit hyperactivity disorder, predominantly hyperactive type: Secondary | ICD-10-CM

## 2012-09-09 DIAGNOSIS — IMO0002 Reserved for concepts with insufficient information to code with codable children: Secondary | ICD-10-CM

## 2012-09-09 MED ORDER — AMPHETAMINE-DEXTROAMPHETAMINE 20 MG PO TABS: 20.0000 mg | ORAL_TABLET | Freq: Every day | ORAL | Status: DC

## 2012-09-23 ENCOUNTER — Telehealth: Payer: Self-pay | Admitting: *Deleted

## 2012-09-23 NOTE — Telephone Encounter (Signed)
Received a call from Andrew Dalton from the [place]. She wanted to know if Andrew Dalton had obtained his PET Scan through his private insurance. Noted that it had been ordered but he has not had it done. She stated that he has been scheduled and no-showed a total of 6 times therefore they are NO LONGER going to be scheduling PET Scans through the [place]. They only do them on Tuesdays and it costs $1500 for each one a pt misses.  Will need to have it done through his private insurance from now on.

## 2012-09-26 ENCOUNTER — Other Ambulatory Visit: Payer: Self-pay | Admitting: *Deleted

## 2012-09-26 ENCOUNTER — Other Ambulatory Visit (HOSPITAL_BASED_OUTPATIENT_CLINIC_OR_DEPARTMENT_OTHER): Payer: Non-veteran care | Admitting: Lab

## 2012-09-26 ENCOUNTER — Ambulatory Visit (HOSPITAL_BASED_OUTPATIENT_CLINIC_OR_DEPARTMENT_OTHER): Payer: Non-veteran care | Admitting: Hematology & Oncology

## 2012-09-26 DIAGNOSIS — C8590 Non-Hodgkin lymphoma, unspecified, unspecified site: Secondary | ICD-10-CM

## 2012-09-26 DIAGNOSIS — F901 Attention-deficit hyperactivity disorder, predominantly hyperactive type: Secondary | ICD-10-CM

## 2012-09-26 DIAGNOSIS — IMO0002 Reserved for concepts with insufficient information to code with codable children: Secondary | ICD-10-CM

## 2012-09-26 LAB — CBC WITH DIFFERENTIAL (CANCER CENTER ONLY)
BASO%: 0.4 % (ref 0.0–2.0)
EOS%: 5.8 % (ref 0.0–7.0)
HCT: 38.9 % (ref 38.7–49.9)
LYMPH%: 9.3 % — ABNORMAL LOW (ref 14.0–48.0)
MCH: 32.6 pg (ref 28.0–33.4)
MCHC: 36.2 g/dL — ABNORMAL HIGH (ref 32.0–35.9)
MCV: 90 fL (ref 82–98)
MONO#: 0.3 10*3/uL (ref 0.1–0.9)
MONO%: 6.6 % (ref 0.0–13.0)
NEUT%: 77.9 % (ref 40.0–80.0)
Platelets: 185 10*3/uL (ref 145–400)
RDW: 12.7 % (ref 11.1–15.7)
WBC: 4.8 10*3/uL (ref 4.0–10.0)

## 2012-09-26 LAB — COMPREHENSIVE METABOLIC PANEL
ALT: 20 U/L (ref 0–53)
Alkaline Phosphatase: 68 U/L (ref 39–117)
CO2: 28 mEq/L (ref 19–32)
Creatinine, Ser: 0.75 mg/dL (ref 0.50–1.35)
Sodium: 141 mEq/L (ref 135–145)
Total Bilirubin: 0.7 mg/dL (ref 0.3–1.2)
Total Protein: 7 g/dL (ref 6.0–8.3)

## 2012-09-26 LAB — LACTATE DEHYDROGENASE: LDH: 144 U/L (ref 94–250)

## 2012-09-26 MED ORDER — ALPRAZOLAM 1 MG PO TABS: ORAL_TABLET | ORAL | Status: DC

## 2012-09-26 MED ORDER — ZOLPIDEM TARTRATE ER 12.5 MG PO TBCR: 12.5000 mg | EXTENDED_RELEASE_TABLET | Freq: Every evening | ORAL | Status: DC | PRN

## 2012-09-26 MED ORDER — AMPHETAMINE-DEXTROAMPHETAMINE 20 MG PO TABS: ORAL_TABLET | ORAL | Status: DC

## 2012-09-26 NOTE — Telephone Encounter (Signed)
Pt requested Xanax and Ambien CR refills while here for an MD visit. He was running on a tight scheduled and needed to leave

## 2012-09-26 NOTE — Progress Notes (Signed)
DIAGNOSIS:  Anaplastic large cell lymphoma, T-cell.  CURRENT THERAPY:  Observation.  INTERIM HISTORY:  Ann comes in for followup.  We completed his chemotherapy back in August.  He had 8 cycles of CHOP.  He reports that he had a little bit of a break during his treatments because of some emotional issues that he had.  These needed to be dealt with in hospital, and he was admitted by the [facility] for treatment and readjustment of his medications.  He really feels well.  He was going to go to [place], but did not. Hopefully, he will go next year.  Unfortunately, he has not had his followup PET scan yet.  He was supposed to have it a few weeks ago, but had a tooth removed.  He then had it rescheduled, but could not make it the day that he was called.  He has had a good appetite.  He is still smoking quite a bit.  He has had no problem with bowels or bladder.  There has been no abdominal pain.  He has had a little bit of discomfort over in the right neck where he had some bulky lymph nodes.  There has been no swelling in his legs.  He does have a new tattoo on his left forearm.  PHYSICAL EXAMINATION:  General:  This is a thin, but well-nourished white gentleman, in no obvious distress.  Vital signs:  97.9, pulse 81, respiratory rate 18, blood pressure 134/69.  Weight is 146.  Head and neck:  Normocephalic, atraumatic skull.  There are no ocular or oral lesions.  There are no palpable cervical or supraclavicular lymph nodes. Lungs:  Clear bilaterally.  Cardiac:  Regular rate and rhythm, with a normal S1 and S2.  There are no murmurs, rubs, or bruits.  Abdomen: Soft, with good bowel sounds.  There is no palpable abdominal mass. There is no palpable hepatosplenomegaly.  Extremities:  No clubbing, cyanosis, or edema.  He has good range of motion of his joints.  Skin: No rashes, ecchymosis, or petechia.  Neurological:  No focal neurological deficits.  LABORATORY STUDIES:  White cell count of  4.8, hemoglobin 14.1, hematocrit 40, platelet count 185,000.  LDH is 144.  IMPRESSION:  Mr. Gillie is a 21 year old gentleman with anaplastic large- cell lymphoma.  This is a T-cell lymphoma.  He is actually ALK positive.  We have gotten him to a good remission.  His last PET scan they had done showed that he was in remission.  We do really need a baseline after he has completed treatments.  We are going to see about trying to get one up in [place].  He does have private insurance that will be able to cover the cost of the PET scan.  He certainly does have a risk of recurrence, but I just have a good sense that his lymphoma will stay in remission and that he will be cured.  We will plan to get him back in about 2 months' time.  We will see about getting the PET scan done in the next couple of weeks.    ______________________________ Josph Macho, M.D. PRE/MEDQ  D:  09/26/2012  T:  09/26/2012  Job:  [id number]

## 2012-09-26 NOTE — Progress Notes (Signed)
This office note has been dictated.

## 2012-10-01 ENCOUNTER — Encounter (HOSPITAL_BASED_OUTPATIENT_CLINIC_OR_DEPARTMENT_OTHER)
Admission: RE | Admit: 2012-10-01 | Discharge: 2012-10-01 | Disposition: A | Discharge status: Home / Self Care | Payer: Non-veteran care | Source: Ambulatory Visit | Attending: Hematology & Oncology | Admitting: Hematology & Oncology

## 2012-10-01 DIAGNOSIS — IMO0002 Reserved for concepts with insufficient information to code with codable children: Secondary | ICD-10-CM

## 2012-10-01 DIAGNOSIS — C8589 Other specified types of non-Hodgkin lymphoma, extranodal and solid organ sites: Secondary | ICD-10-CM | POA: Insufficient documentation

## 2012-10-01 MED ORDER — FLUDEOXYGLUCOSE F - 18 (FDG) INJECTION
14.4000 | Freq: Once | INTRAVENOUS | Status: AC | PRN
  Administered 2012-10-01: 14.4 via INTRAVENOUS

## 2012-10-06 ENCOUNTER — Telehealth: Payer: Self-pay | Admitting: Oncology

## 2012-10-06 NOTE — Telephone Encounter (Addendum)
Message copied by Lacie Draft on Mon Oct 06, 2012  4:17 PM ------      Message from: Josph Macho      Created: Sun Oct 05, 2012  4:51 PM       Call Tyus - lymphoma is in remission!!!  Cindee Lame  10/06/2012 Spoke with Gregary Signs and gave him the good news. Teola Bradley, Amani Marseille [employer]

## 2012-10-09 ENCOUNTER — Other Ambulatory Visit: Payer: Self-pay | Admitting: *Deleted

## 2012-10-09 DIAGNOSIS — C8590 Non-Hodgkin lymphoma, unspecified, unspecified site: Secondary | ICD-10-CM

## 2012-10-09 DIAGNOSIS — F901 Attention-deficit hyperactivity disorder, predominantly hyperactive type: Secondary | ICD-10-CM

## 2012-10-09 DIAGNOSIS — IMO0002 Reserved for concepts with insufficient information to code with codable children: Secondary | ICD-10-CM

## 2012-10-09 MED ORDER — AMPHETAMINE-DEXTROAMPHETAMINE 20 MG PO TABS: ORAL_TABLET | ORAL | Status: DC

## 2012-10-09 NOTE — Telephone Encounter (Signed)
Pt requested a rx for Adderall to be called in downstairs. He stated he was unable to get it filled last time he was here and has lost his copy. It was relayed to the pt that Adderall has to be picked up from the office. Reviewed with Dr Myna Hidalgo. Ok to refill this time. Will route to him for approval & signature then will place at the front desk for pick up.

## 2012-10-14 ENCOUNTER — Other Ambulatory Visit: Payer: Self-pay | Admitting: *Deleted

## 2012-10-14 ENCOUNTER — Other Ambulatory Visit: Payer: Self-pay | Admitting: Hematology & Oncology

## 2012-10-14 ENCOUNTER — Ambulatory Visit (HOSPITAL_BASED_OUTPATIENT_CLINIC_OR_DEPARTMENT_OTHER)
Admission: RE | Admit: 2012-10-14 | Discharge: 2012-10-14 | Disposition: A | Discharge status: Home / Self Care | Payer: Non-veteran care | Source: Ambulatory Visit | Attending: Hematology & Oncology | Admitting: Hematology & Oncology

## 2012-10-14 ENCOUNTER — Ambulatory Visit (HOSPITAL_BASED_OUTPATIENT_CLINIC_OR_DEPARTMENT_OTHER): Payer: Non-veteran care

## 2012-10-14 ENCOUNTER — Other Ambulatory Visit (HOSPITAL_BASED_OUTPATIENT_CLINIC_OR_DEPARTMENT_OTHER): Payer: Non-veteran care | Admitting: Lab

## 2012-10-14 VITALS — BP 132/74 | HR 85 | Temp 98.2°F | Resp 16

## 2012-10-14 DIAGNOSIS — R112 Nausea with vomiting, unspecified: Secondary | ICD-10-CM

## 2012-10-14 DIAGNOSIS — IMO0002 Reserved for concepts with insufficient information to code with codable children: Secondary | ICD-10-CM

## 2012-10-14 DIAGNOSIS — R110 Nausea: Secondary | ICD-10-CM

## 2012-10-14 DIAGNOSIS — R52 Pain, unspecified: Secondary | ICD-10-CM

## 2012-10-14 DIAGNOSIS — E876 Hypokalemia: Secondary | ICD-10-CM

## 2012-10-14 DIAGNOSIS — E860 Dehydration: Secondary | ICD-10-CM

## 2012-10-14 DIAGNOSIS — R197 Diarrhea, unspecified: Secondary | ICD-10-CM | POA: Insufficient documentation

## 2012-10-14 LAB — CBC WITH DIFFERENTIAL (CANCER CENTER ONLY)
BASO#: 0 10*3/uL (ref 0.0–0.2)
Eosinophils Absolute: 0 10*3/uL (ref 0.0–0.5)
HGB: 17.5 g/dL — ABNORMAL HIGH (ref 13.0–17.1)
MCH: 32.4 pg (ref 28.0–33.4)
MCV: 86 fL (ref 82–98)
MONO#: 1.1 10*3/uL — ABNORMAL HIGH (ref 0.1–0.9)
MONO%: 9 % (ref 0.0–13.0)
NEUT#: 10.2 10*3/uL — ABNORMAL HIGH (ref 1.5–6.5)
Platelets: 327 10*3/uL (ref 145–400)
RBC: 5.4 10*6/uL (ref 4.20–5.70)
WBC: 12.2 10*3/uL — ABNORMAL HIGH (ref 4.0–10.0)

## 2012-10-14 LAB — CMP (CANCER CENTER ONLY)
Albumin: 6 g/dL — ABNORMAL HIGH (ref 3.3–5.5)
Alkaline Phosphatase: 75 U/L (ref 26–84)
BUN, Bld: 10 mg/dL (ref 7–22)
CO2: 32 mEq/L (ref 18–33)
Calcium: 10.7 mg/dL — ABNORMAL HIGH (ref 8.0–10.3)
Chloride: 91 mEq/L — ABNORMAL LOW (ref 98–108)
Glucose, Bld: 132 mg/dL — ABNORMAL HIGH (ref 73–118)
Potassium: 3.2 mEq/L — ABNORMAL LOW (ref 3.3–4.7)
Sodium: 142 mEq/L (ref 128–145)
Total Protein: 9.5 g/dL — ABNORMAL HIGH (ref 6.4–8.1)

## 2012-10-14 MED ORDER — HYDROMORPHONE HCL PF 1 MG/ML IJ SOLN
1.0000 mg | Freq: Once | INTRAMUSCULAR | Status: AC
  Administered 2012-10-14: 1 mg via INTRAVENOUS
  Filled 2012-10-14: qty 1

## 2012-10-14 MED ORDER — LORAZEPAM 2 MG/ML IJ SOLN
1.0000 mg | Freq: Once | INTRAMUSCULAR | Status: AC
  Administered 2012-10-14: 1 mg via INTRAVENOUS

## 2012-10-14 MED ORDER — SODIUM CHLORIDE 0.9 % IV SOLN
Freq: Once | INTRAVENOUS | Status: AC
  Administered 2012-10-14: 10:00:00 via INTRAVENOUS

## 2012-10-14 MED ORDER — FAMOTIDINE IN NACL 20-0.9 MG/50ML-% IV SOLN
20.0000 mg | Freq: Once | INTRAVENOUS | Status: AC
  Administered 2012-10-14: 20 mg via INTRAVENOUS

## 2012-10-14 MED ORDER — PROMETHAZINE HCL 25 MG/ML IJ SOLN
12.5000 mg | Freq: Once | INTRAMUSCULAR | Status: AC
  Administered 2012-10-14: 12.5 mg via INTRAVENOUS

## 2012-10-14 MED ORDER — POTASSIUM CHLORIDE 20 MEQ/15ML (10%) PO SOLN: 40.0000 meq | Freq: Once | ORAL | Status: DC

## 2012-10-14 MED ORDER — SODIUM CHLORIDE 0.9 % IV SOLN: Freq: Once | INTRAVENOUS | Status: DC

## 2012-10-14 NOTE — Telephone Encounter (Signed)
K+ 3.2 today. To take 40 meq PO today x1. Pt is unable to swallow pills at this time due to nausea. To order liquid instead. Called in to [facility] per Dr Myna Hidalgo.

## 2012-10-14 NOTE — Progress Notes (Signed)
Pt's mother called stating Andrew Dalton has had n/v/d x 2 weeks that they thought was related to anxiety due to his grandmothers upcoming open heart surgery but it seems to be worsening. Wants to know if there is anything that Dr Myna Hidalgo can do as he doesn't want to go to the [place]. Reviewed with Dr Myna Hidalgo. To come in for hydration, antiemetics, labs, and abd x-ray.

## 2012-10-14 NOTE — Patient Instructions (Signed)
Nausea and Vomiting Nausea means you feel sick to your stomach. Throwing up (vomiting) is a reflex where stomach contents come out of your mouth. HOME CARE   Take medicine as told by your doctor.  Do not force yourself to eat. However, you do need to drink fluids.  If you feel like eating, eat a normal diet as told by your doctor.  Eat rice, wheat, potatoes, bread, lean meats, yogurt, fruits, and vegetables.  Avoid high-fat foods.  Drink enough fluids to keep your pee (urine) clear or pale yellow.  Ask your doctor how to replace body fluid losses (rehydrate). Signs of body fluid loss (dehydration) include:  Feeling very thirsty.  Dry lips and mouth.  Feeling dizzy.  Dark pee.  Peeing less than normal.  Feeling confused.  Fast breathing or heart rate. GET HELP RIGHT AWAY IF:   You have blood in your throw up.  You have black or bloody poop (stool).  You have a bad headache or stiff neck.  You feel confused.  You have bad belly (abdominal) pain.  You have chest pain or trouble breathing.  You do not pee at least once every 8 hours.  You have cold, clammy skin.  You keep throwing up after 24 to 48 hours.  You have a fever. MAKE SURE YOU:   Understand these instructions.  Will watch your condition.  Will get help right away if you are not doing well or get worse. Document Released: 05/14/2008 Document Revised: 02/18/2012 Document Reviewed: 04/27/2011 [facility] Patient Information 2013 [place], [place]. Diarrhea Diarrhea is watery poop (stool). The most common cause of diarrhea is a germ. Other causes include:  Food poisoning.  A reaction to medicine. HOME CARE   Drink clear fluids. This can stop you from losing too much body fluid (dehydration).  Drink enough fluids to keep your pee (urine) clear or pale yellow.  Avoid solid foods and dairy products until you start to feel better. Then start eating bland foods, such  as:  Bananas.  Rice.  Crackers.  Applesauce.  Dry toast.  Avoid spicy foods, caffeine, and alcohol.  Your doctor may give medicine to help with cramps and watery poop. Take this as told. Avoid these medicines if you have a fever or blood in your poop.  Take your medicine as told. Finish them even if you start to feel better. GET HELP RIGHT AWAY IF:   The watery poop lasts longer than 3 days.  You have a fever.  Your baby is older than 3 months with a rectal temperature of 100.5 F (38.1 C) or higher for more than 1 day.  There is blood in your poop.  You start to throw up (vomit).  You lose too much fluid. MAKE SURE YOU:   Understand these instructions.  Will watch your condition.  Will get help right away if you are not doing well or get worse. Document Released: 05/14/2008 Document Revised: 02/18/2012 Document Reviewed: 05/14/2008 [facility] Patient Information 2013 [place], [place].

## 2012-10-14 NOTE — Progress Notes (Signed)
1200: Pt continues to have nausea. Needs to take PO K+. Obtained order from Dr Myna Hidalgo for Phenergan 12.5 mg x 1 now. May repeat if needed.

## 2012-10-15 ENCOUNTER — Observation Stay (HOSPITAL_COMMUNITY)
Admission: EM | Admit: 2012-10-15 | Discharge: 2012-10-16 | Disposition: A | Discharge status: Home / Self Care | Payer: Non-veteran care | Attending: Emergency Medicine | Admitting: Emergency Medicine

## 2012-10-15 ENCOUNTER — Other Ambulatory Visit: Payer: Self-pay | Admitting: *Deleted

## 2012-10-15 ENCOUNTER — Encounter (HOSPITAL_COMMUNITY): Payer: Self-pay | Admitting: Family Medicine

## 2012-10-15 DIAGNOSIS — R112 Nausea with vomiting, unspecified: Secondary | ICD-10-CM

## 2012-10-15 DIAGNOSIS — IMO0002 Reserved for concepts with insufficient information to code with codable children: Secondary | ICD-10-CM

## 2012-10-15 DIAGNOSIS — R7989 Other specified abnormal findings of blood chemistry: Secondary | ICD-10-CM

## 2012-10-15 DIAGNOSIS — Z87820 Personal history of traumatic brain injury: Secondary | ICD-10-CM | POA: Insufficient documentation

## 2012-10-15 DIAGNOSIS — C8589 Other specified types of non-Hodgkin lymphoma, extranodal and solid organ sites: Secondary | ICD-10-CM | POA: Insufficient documentation

## 2012-10-15 DIAGNOSIS — E876 Hypokalemia: Secondary | ICD-10-CM

## 2012-10-15 DIAGNOSIS — G8929 Other chronic pain: Secondary | ICD-10-CM | POA: Insufficient documentation

## 2012-10-15 DIAGNOSIS — E860 Dehydration: Principal | ICD-10-CM | POA: Insufficient documentation

## 2012-10-15 LAB — BASIC METABOLIC PANEL
BUN: 19 mg/dL (ref 6–23)
CO2: 32 mEq/L (ref 19–32)
Calcium: 10.1 mg/dL (ref 8.4–10.5)
Creatinine, Ser: 1.26 mg/dL (ref 0.50–1.35)
GFR calc non Af Amer: 79 mL/min — ABNORMAL LOW (ref >90)
Glucose, Bld: 100 mg/dL — ABNORMAL HIGH (ref 70–99)

## 2012-10-15 MED ORDER — POTASSIUM CHLORIDE CRYS ER 20 MEQ PO TBCR
40.0000 meq | EXTENDED_RELEASE_TABLET | Freq: Once | ORAL | Status: AC
  Administered 2012-10-15: 40 meq via ORAL
  Filled 2012-10-15: qty 2

## 2012-10-15 MED ORDER — ONDANSETRON HCL 4 MG/2ML IJ SOLN: 4.0000 mg | Freq: Four times a day (QID) | INTRAMUSCULAR | Status: DC | PRN

## 2012-10-15 MED ORDER — LORAZEPAM 2 MG/ML IJ SOLN
1.0000 mg | Freq: Once | INTRAMUSCULAR | Status: AC
  Administered 2012-10-15: 1 mg via INTRAVENOUS
  Filled 2012-10-15: qty 1

## 2012-10-15 MED ORDER — PROMETHAZINE HCL 25 MG/ML IJ SOLN: 25.0000 mg | INTRAMUSCULAR | Status: DC | PRN

## 2012-10-15 MED ORDER — SODIUM CHLORIDE 0.9 % IV SOLN
1000.0000 mL | INTRAVENOUS | Status: DC
  Administered 2012-10-15 – 2012-10-16 (×2): 1000 mL via INTRAVENOUS

## 2012-10-15 MED ORDER — MORPHINE SULFATE 10 MG/ML IJ SOLN
2.0000 mg | INTRAMUSCULAR | Status: DC | PRN
  Administered 2012-10-15: 2 mg via INTRAVENOUS
  Filled 2012-10-15: qty 1

## 2012-10-15 MED ORDER — SODIUM CHLORIDE 0.9 % IV SOLN: 1000.0000 mL | Freq: Once | INTRAVENOUS | Status: DC

## 2012-10-15 MED ORDER — SODIUM CHLORIDE 0.9 % IV SOLN
1000.0000 mL | Freq: Once | INTRAVENOUS | Status: AC
  Administered 2012-10-15: 1000 mL via INTRAVENOUS

## 2012-10-15 MED ORDER — MAGNESIUM HYDROXIDE 400 MG/5ML PO SUSP: 30.0000 mL | Freq: Every evening | ORAL | Status: DC | PRN

## 2012-10-15 MED ORDER — POTASSIUM CHLORIDE 10 MEQ/100ML IV SOLN
10.0000 meq | Freq: Once | INTRAVENOUS | Status: AC
  Administered 2012-10-15: 10 meq via INTRAVENOUS
  Filled 2012-10-15: qty 100

## 2012-10-15 MED ORDER — ACETAMINOPHEN 325 MG PO TABS: 650.0000 mg | ORAL_TABLET | ORAL | Status: DC | PRN

## 2012-10-15 MED ORDER — SODIUM CHLORIDE 0.9 % IV BOLUS (SEPSIS)
1000.0000 mL | Freq: Once | INTRAVENOUS | Status: AC
  Administered 2012-10-15: 1000 mL via INTRAVENOUS

## 2012-10-15 MED ORDER — PROMETHAZINE HCL 25 MG/ML IJ SOLN
25.0000 mg | INTRAMUSCULAR | Status: DC | PRN
  Administered 2012-10-15: 25 mg via INTRAVENOUS
  Filled 2012-10-15: qty 1

## 2012-10-15 MED ORDER — ZOLPIDEM TARTRATE 5 MG PO TABS
5.0000 mg | ORAL_TABLET | Freq: Every evening | ORAL | Status: DC | PRN
  Administered 2012-10-15: 5 mg via ORAL
  Filled 2012-10-15: qty 1

## 2012-10-15 MED ORDER — PROMETHAZINE HCL 25 MG PO TABS: 25.0000 mg | ORAL_TABLET | Freq: Four times a day (QID) | ORAL | Status: DC | PRN

## 2012-10-15 MED ORDER — ONDANSETRON HCL 4 MG/2ML IJ SOLN
4.0000 mg | Freq: Once | INTRAMUSCULAR | Status: AC
  Administered 2012-10-15: 4 mg via INTRAVENOUS
  Filled 2012-10-15: qty 2
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
## 2012-10-15 NOTE — ED Notes (Signed)
Pt sts he has been vomiting all day and was sent here by his Dr. For dehydration. sts also diarrhea. Pt in remission.

## 2012-10-15 NOTE — ED Notes (Signed)
Pt is requesting his port be accessed rather than having a peripheral IV. IV team paged.

## 2012-10-15 NOTE — ED Notes (Signed)
IV team at bedside 

## 2012-10-15 NOTE — Telephone Encounter (Addendum)
Sent message to scheduling to call the pt tomorrow (grandmother is having open-heart surgery today). To have him speak with a nurse if he has questions.  Message copied by Wynonia Hazard on Wed Oct 15, 2012  3:18 PM ------      Message from: Josph Macho      Created: Tue Oct 14, 2012  8:18 PM       Magdelene Ruark:            Please make sure that we check his C-met in 1 week.  The elevated creatinine is bothering me.            Cindee Lame

## 2012-10-15 NOTE — Telephone Encounter (Signed)
Pt's mother called. He has vomited x 2 today. Wants to know if something can be called in. They are at [facility] because his grandmother is having open-heart surgery. Reviewed with Dr Myna Hidalgo. Ok to give Phenergan. Sent via e-prescribe to [facility].

## 2012-10-15 NOTE — ED Notes (Signed)
IV team paged.  

## 2012-10-15 NOTE — ED Provider Notes (Signed)
Patient with a hx sig for Anaplastic large cell lymphoma of the head, face, neck which is reportedly in remission with last chemo treatment in Oct was placed in CDU on dehydration protocol by Orvis Brill.  Patient is here for nausea vomiting and diarrhea and has received potassium replacement, fluids, pain medication and anti-emetics. Patient re-evaluated and continues to have some pain and nausea but he states the pain is tolerable. His VSS and he has no new complaints or concerns at this time.  On exam: hemodynamically stable, NAD, heart w/ RRR, lungs CTAB, mild left upper quadrant abdominal tenderness, no peripheral edema or calf tenderness.  Plan per previous provider is to continue rehydration and potassium supplementation, thereby collection of labs to ensure the patient can tolerate by mouth fluids.     Pt given ativan 1mg  for anxiety and sleep.  Will repeat bolus and continue infusion.  Pt resting comfortably.    Dahlia Client Gazella Anglin, PA-C 10/15/12 2327

## 2012-10-16 ENCOUNTER — Encounter (HOSPITAL_COMMUNITY): Payer: Self-pay | Admitting: Emergency Medicine

## 2012-10-16 ENCOUNTER — Emergency Department (HOSPITAL_COMMUNITY)
Admission: EM | Admit: 2012-10-16 | Discharge: 2012-10-18 | Disposition: A | Discharge status: Home / Self Care | Payer: Non-veteran care | Attending: Emergency Medicine | Admitting: Emergency Medicine

## 2012-10-16 ENCOUNTER — Encounter: Payer: Self-pay | Admitting: *Deleted

## 2012-10-16 ENCOUNTER — Telehealth: Payer: Self-pay | Admitting: Hematology & Oncology

## 2012-10-16 DIAGNOSIS — R4182 Altered mental status, unspecified: Secondary | ICD-10-CM | POA: Insufficient documentation

## 2012-10-16 DIAGNOSIS — G8921 Chronic pain due to trauma: Secondary | ICD-10-CM | POA: Insufficient documentation

## 2012-10-16 DIAGNOSIS — F4322 Adjustment disorder with anxiety: Secondary | ICD-10-CM

## 2012-10-16 DIAGNOSIS — Z87891 Personal history of nicotine dependence: Secondary | ICD-10-CM | POA: Insufficient documentation

## 2012-10-16 DIAGNOSIS — Z87820 Personal history of traumatic brain injury: Secondary | ICD-10-CM | POA: Insufficient documentation

## 2012-10-16 DIAGNOSIS — F4310 Post-traumatic stress disorder, unspecified: Secondary | ICD-10-CM | POA: Insufficient documentation

## 2012-10-16 DIAGNOSIS — Z79899 Other long term (current) drug therapy: Secondary | ICD-10-CM | POA: Insufficient documentation

## 2012-10-16 DIAGNOSIS — Z87828 Personal history of other (healed) physical injury and trauma: Secondary | ICD-10-CM | POA: Insufficient documentation

## 2012-10-16 DIAGNOSIS — Z87448 Personal history of other diseases of urinary system: Secondary | ICD-10-CM | POA: Insufficient documentation

## 2012-10-16 DIAGNOSIS — E876 Hypokalemia: Secondary | ICD-10-CM | POA: Insufficient documentation

## 2012-10-16 DIAGNOSIS — R748 Abnormal levels of other serum enzymes: Secondary | ICD-10-CM | POA: Insufficient documentation

## 2012-10-16 DIAGNOSIS — IMO0002 Reserved for concepts with insufficient information to code with codable children: Secondary | ICD-10-CM | POA: Insufficient documentation

## 2012-10-16 DIAGNOSIS — R112 Nausea with vomiting, unspecified: Secondary | ICD-10-CM | POA: Insufficient documentation

## 2012-10-16 LAB — BASIC METABOLIC PANEL
BUN: 18 mg/dL (ref 6–23)
CO2: 28 mEq/L (ref 19–32)
Chloride: 94 mEq/L — ABNORMAL LOW (ref 96–112)
Creatinine, Ser: 1.17 mg/dL (ref 0.50–1.35)
Glucose, Bld: 90 mg/dL (ref 70–99)

## 2012-10-16 LAB — CBC WITH DIFFERENTIAL/PLATELET
Basophils Absolute: 0 10*3/uL (ref 0.0–0.1)
Eosinophils Relative: 1 % (ref 0–5)
HCT: 38.2 % — ABNORMAL LOW (ref 39.0–52.0)
Hemoglobin: 13.9 g/dL (ref 13.0–17.0)
Lymphocytes Relative: 13 % (ref 12–46)
Lymphs Abs: 0.8 10*3/uL (ref 0.7–4.0)
MCV: 86.8 fL (ref 78.0–100.0)
Monocytes Absolute: 0.5 10*3/uL (ref 0.1–1.0)
Monocytes Relative: 9 % (ref 3–12)
Neutro Abs: 4.6 10*3/uL (ref 1.7–7.7)
RBC: 4.4 MIL/uL (ref 4.22–5.81)
WBC: 5.9 10*3/uL (ref 4.0–10.5)

## 2012-10-16 LAB — URINALYSIS, ROUTINE W REFLEX MICROSCOPIC
Glucose, UA: NEGATIVE mg/dL
Hgb urine dipstick: NEGATIVE
Ketones, ur: NEGATIVE mg/dL
Leukocytes, UA: NEGATIVE
pH: 6 (ref 5.0–8.0)

## 2012-10-16 LAB — ETHANOL: Alcohol, Ethyl (B): <11 mg/dL (ref 0–11)

## 2012-10-16 LAB — RAPID URINE DRUG SCREEN, HOSP PERFORMED
Amphetamines: POSITIVE — AB
Benzodiazepines: NOT DETECTED
Tetrahydrocannabinol: POSITIVE — AB

## 2012-10-16 MED ORDER — POTASSIUM CHLORIDE CRYS ER 20 MEQ PO TBCR
40.0000 meq | EXTENDED_RELEASE_TABLET | Freq: Once | ORAL | Status: AC
  Administered 2012-10-16: 40 meq via ORAL
  Filled 2012-10-16: qty 2

## 2012-10-16 MED ORDER — MIDAZOLAM HCL 10 MG/2ML IJ SOLN: 4.0000 mg | Freq: Once | INTRAMUSCULAR | Status: DC

## 2012-10-16 MED ORDER — LORAZEPAM 2 MG/ML IJ SOLN
INTRAMUSCULAR | Status: AC
  Filled 2012-10-16: qty 1

## 2012-10-16 MED ORDER — POTASSIUM CHLORIDE 10 MEQ/100ML IV SOLN
10.0000 meq | Freq: Once | INTRAVENOUS | Status: AC
  Administered 2012-10-16: 10 meq via INTRAVENOUS
  Filled 2012-10-16 (×2): qty 100

## 2012-10-16 MED ORDER — PROMETHAZINE HCL 25 MG PO TABS
25.0000 mg | ORAL_TABLET | Freq: Four times a day (QID) | ORAL | Status: DC | PRN
  Administered 2012-10-16 – 2012-10-17 (×2): 25 mg via ORAL
  Filled 2012-10-16 (×2): qty 1

## 2012-10-16 MED ORDER — ONDANSETRON HCL 8 MG PO TABS: 8.0000 mg | ORAL_TABLET | ORAL | Status: DC | PRN

## 2012-10-16 MED ORDER — AMPHETAMINE-DEXTROAMPHETAMINE 20 MG PO TABS
20.0000 mg | ORAL_TABLET | Freq: Every day | ORAL | Status: DC
  Administered 2012-10-16: 20 mg via ORAL
  Filled 2012-10-16 (×2): qty 1

## 2012-10-16 MED ORDER — ONDANSETRON 8 MG PO TBDP
8.0000 mg | ORAL_TABLET | Freq: Three times a day (TID) | ORAL | Status: DC | PRN
  Administered 2012-10-16: 8 mg via ORAL
  Filled 2012-10-16: qty 1

## 2012-10-16 MED ORDER — HYDROMORPHONE HCL 2 MG PO TABS
4.0000 mg | ORAL_TABLET | ORAL | Status: DC | PRN
  Administered 2012-10-16 – 2012-10-17 (×4): 4 mg via ORAL
  Filled 2012-10-16 (×4): qty 2

## 2012-10-16 MED ORDER — LORAZEPAM 1 MG PO TABS
1.0000 mg | ORAL_TABLET | Freq: Three times a day (TID) | ORAL | Status: DC | PRN
  Administered 2012-10-16 – 2012-10-17 (×4): 1 mg via ORAL
  Filled 2012-10-16 (×4): qty 1

## 2012-10-16 MED ORDER — ZIPRASIDONE MESYLATE 20 MG IM SOLR
20.0000 mg | Freq: Once | INTRAMUSCULAR | Status: AC
  Administered 2012-10-16: 20 mg via INTRAMUSCULAR
  Filled 2012-10-16: qty 20

## 2012-10-16 MED ORDER — POTASSIUM CHLORIDE 10 MEQ/100ML IV SOLN
10.0000 meq | Freq: Once | INTRAVENOUS | Status: AC
  Administered 2012-10-16: 10 meq via INTRAVENOUS
  Filled 2012-10-16: qty 100

## 2012-10-16 MED ORDER — ZOLPIDEM TARTRATE 5 MG PO TABS
5.0000 mg | ORAL_TABLET | Freq: Every evening | ORAL | Status: DC | PRN
  Administered 2012-10-16 – 2012-10-17 (×2): 5 mg via ORAL
  Filled 2012-10-16 (×2): qty 1

## 2012-10-16 MED ORDER — LORAZEPAM 2 MG/ML IJ SOLN
1.0000 mg | Freq: Once | INTRAMUSCULAR | Status: AC
  Administered 2012-10-16: 1 mg via INTRAVENOUS

## 2012-10-16 MED ORDER — LORAZEPAM 1 MG PO TABS
1.0000 mg | ORAL_TABLET | Freq: Once | ORAL | Status: AC
  Administered 2012-10-16: 1 mg via ORAL
  Filled 2012-10-16: qty 1

## 2012-10-16 MED ORDER — SODIUM CHLORIDE 0.9 % IV BOLUS (SEPSIS)
500.0000 mL | Freq: Once | INTRAVENOUS | Status: AC
  Administered 2012-10-16: 500 mL via INTRAVENOUS

## 2012-10-16 NOTE — Progress Notes (Signed)
[facility] ED CM called [facility]  Main number [phone] and transferred to Clydie Braun in call center who provided pt pcp as Dr Newt Lukes in [facility] and CM as Delice Bison Zollicoffer [phone] cell/[phone] ext [phone] [facility] ED Cm left a voice message at both contact numbers requesting a return call to inquire what may be needed Cm requested return call back to CM mobile and/or TCU contact numbers CM inquired about case with charge RN who states family in waiting room

## 2012-10-16 NOTE — ED Notes (Signed)
The patient is AOx4 and comfortable with his discharge instructions. 

## 2012-10-16 NOTE — Progress Notes (Signed)
CM went to waiting room No family present Chiropodist states family left and ACT member will provide assistance

## 2012-10-16 NOTE — Progress Notes (Signed)
[facility] ED CM received a return call from [facility], [facility] assigned [facility] [phone]) (who has spoken with the mother, tara is not pt [facility] CM). CM confirmed with [facility] pt is being cleared medically K 2.9 is being addressed.  Confirmed transfer request is for mental inpatient placement No clinicals needed from ED CM.  Victorino Dike will touch basis with [facility] transportation coordinator to have transfer package faxed to [phone] (fax number had been given to [place] by the mother) Victorino Dike inquired about pt mental status Cm reported confusion, hallucinations and aggressive behavior. TCU RN updated Victorino Dike requests [employer] staff contact her prn at number provided above

## 2012-10-16 NOTE — Telephone Encounter (Signed)
Pt aware of 10-20-12 lab
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
## 2012-10-16 NOTE — ED Notes (Signed)
Patient agitated upon arrival.  Patient reports hallucinations.  Patient's mother reports that patient has CA and PTSD.  Patient's mother reports that the patient was hallucinating and aggressive earlier today and checked out of the hospital recently with plasma levels that were abnormal.

## 2012-10-16 NOTE — ED Notes (Signed)
Spoke with the covering MD about obtaining orders for pain and MD is going to evaluate.

## 2012-10-16 NOTE — Progress Notes (Signed)
[facility] ED CM spoke with ACT member, toyka, who is processing pt for [facility] inpatient services and has a contact person for [place] transfer coordination.  ACT member will contact ED SW and or ED CM prn

## 2012-10-16 NOTE — ED Notes (Signed)
Patient transported to psych ED with security

## 2012-10-16 NOTE — ED Notes (Addendum)
Pt transferred to psych ED from triage.  He does not engage in conversation but gives random responses to questions.  Has poor eye contact. Affect is flat.  He grabs into air as if responding to visual hallucinations.  While standing in the hallway, he began kicking the door. He then went into his room, grabbed a pillow and tossed it to a Engineer, materials.  Physician was notified and order obtained for restraints.  He is emotionally guarded and defensively swung his arms at nursing tech when she moved.  He stated that he was "easily spooked."  Pt was advised that if he could not control his behavior, he would have to be placed in restraints.  He verbalized understanding, then closed his eyes. Restraints not placed at this time. Will cont to monitor.

## 2012-10-16 NOTE — ED Notes (Signed)
Telepsych in progress. 

## 2012-10-16 NOTE — Progress Notes (Signed)
[facility] ED CM consulted by [facility] ED Assistant director, Traci related calls earlier in morning regarding pt leaving [facility] ED to [facility]. [facility] state [facility] CM [facility]. [facility] ED CM consulted with ED SW and concluded pt is not inpatient therefore should only need transfer to [facility]
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
## 2012-10-16 NOTE — ED Notes (Addendum)
Pt wanting to walk around. Pt advised against the safety of doing so do to being a Flight Risk.

## 2012-10-16 NOTE — Progress Notes (Signed)
[facility] ED CM reviewed labs, imaging, vss in EPIC noted K 2.9 + drug screen positive for opiates, Amphetamines & Tetrahydrocannabinol ED CM discussed with and recommended TCU RN, Shanda Bumps make EDP aware of low K for management prn

## 2012-10-17 ENCOUNTER — Emergency Department (HOSPITAL_COMMUNITY): Payer: Non-veteran care

## 2012-10-17 LAB — POTASSIUM: Potassium: 3.6 mEq/L (ref 3.5–5.1)

## 2012-10-17 MED ORDER — LORAZEPAM 1 MG PO TABS
2.0000 mg | ORAL_TABLET | Freq: Four times a day (QID) | ORAL | Status: DC | PRN
  Administered 2012-10-18: 2 mg via ORAL
  Filled 2012-10-17: qty 2

## 2012-10-17 MED ORDER — IOHEXOL 300 MG/ML  SOLN
100.0000 mL | Freq: Once | INTRAMUSCULAR | Status: AC | PRN
  Administered 2012-10-17: 100 mL via INTRAVENOUS

## 2012-10-17 MED ORDER — HALOPERIDOL LACTATE 5 MG/ML IJ SOLN
10.0000 mg | Freq: Once | INTRAMUSCULAR | Status: AC
  Administered 2012-10-17: 10 mg via INTRAMUSCULAR

## 2012-10-17 MED ORDER — HEPARIN SOD (PORK) LOCK FLUSH 100 UNIT/ML IV SOLN
INTRAVENOUS | Status: AC
  Administered 2012-10-17: 22:00:00
  Filled 2012-10-17: qty 5

## 2012-10-17 MED ORDER — QUETIAPINE FUMARATE 100 MG PO TABS
200.0000 mg | ORAL_TABLET | Freq: Every day | ORAL | Status: DC
  Administered 2012-10-17: 200 mg via ORAL
  Filled 2012-10-17: qty 2

## 2012-10-17 MED ORDER — ZIPRASIDONE MESYLATE 20 MG IM SOLR: 20.0000 mg | Freq: Two times a day (BID) | INTRAMUSCULAR | Status: DC | PRN

## 2012-10-17 MED ORDER — HALOPERIDOL LACTATE 5 MG/ML IJ SOLN
INTRAMUSCULAR | Status: AC
  Filled 2012-10-17: qty 2

## 2012-10-17 NOTE — ED Notes (Signed)
Pt has port -  RN to obtain lab.

## 2012-10-17 NOTE — Clinical Social Work Note (Signed)
CSW contacted [employer] at [phone] (pt [facility] representative).  She stated that if pt wanted to speak to her directly to allow him.  CSW f/u with [facility] transfer coordinator Nicholos Johns.  Nicholos Johns stated that she would check to see if the transfer packet completed by ACT Jessie Foot) has been received.  CSW reviewed transfer packet to notice that the physician has not signed any of the documentation needed for the transfer.  Per Victorino Dike this should be an ED to inpatient bed transfer.  Nicholos Johns will contact CSW back in re: to transfer.  CSW will continue to follow. Vickii Penna, LCSWA [phone]  Clinical Social Work
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
## 2012-10-17 NOTE — Clinical Social Work Note (Signed)
CSW reviewed tele-psych conducted 11/7/20113 by Dr Ma Hillock, MD.  Dr. Berlin Hun states in narrative, "pt grossly psychotic and in need of inpatient stabilization and an organic w/u."  Her recommendations go on to state, "admit to psych unit.  1. Admit to psych facility under IVC.  2.  Complete organic w/u including ct of brain (first psychotic break)  Medications: Seroquel XR 200 mg po qhs (3 hrs before bedtime); Ativan 2mg  po/im q 6 hr prn anxiety; d/c adderall; Geodon 20mg  IM q 12hr prn agitation (not to exceed 40mg  q 24hr)."  CSW informed MD and RN of tele-psych recommendations.  Placed tele-psych in chart. Vickii Penna, LCSWA [phone]  Clinical Social Work

## 2012-10-17 NOTE — ED Notes (Signed)
Pt is under impression that he is being discharged home this am.  Counselors note states he will be transferred to [facility] mental hospital for admission. Barnett Hatter P

## 2012-10-17 NOTE — BHH Counselor (Signed)
Declined by [facility] due to medical acuity. Needs to be medically stable. *CT Scan has been ordered some labs also not good, re-running some labs.  Pt also referred to [facility]. Christiane Ha verified bed availability.   Pt referred to [facility] and pending review.

## 2012-10-17 NOTE — ED Provider Notes (Addendum)
Kaspar Albornoz is a 21 y.o. male who is being evaluated for confusion, hallucinations, and possible exacerbation of postherpetic stress disorder. I attempted to evaluate him, but he was too sedated to wake up secondary to recent administration of Haldol, and Dilaudid. These had been given, because he was agitated.  In review of the chart, he has a history of chronic pain. There is no good documentation, on any recent dosing for narcotics. I will discontinue the Dilaudid.  The patient will require reassessment, and likely need to be evaluated by psychiatry services.  ACT notified  Flint Melter, MD 10/17/12 1027  The patient has been seen by a tele-psychiatrist. She recommends admit to a psychiatric facility under involuntary commit her. She recommends starting on Seroquel 200 mg q. at bedtime, Ativan, 2 mg every 6 hours when necessary, and Geodon 20 mg every 12 hours when necessary agitation. The patient is to stop his Adderall. She also recommended checking a CT for this first psychotic break.  The patient is reported to be in remission from lymphoma of the head and neck.  CT Head- negative  Flint Melter, MD 10/21/12 [phone]

## 2012-10-18 ENCOUNTER — Encounter (HOSPITAL_COMMUNITY): Payer: Self-pay | Admitting: *Deleted

## 2012-10-18 ENCOUNTER — Observation Stay (HOSPITAL_COMMUNITY)
Admission: EM | Admit: 2012-10-18 | Discharge: 2012-10-20 | Disposition: A | Discharge status: Home / Self Care | Payer: Non-veteran care | Attending: Internal Medicine | Admitting: Internal Medicine

## 2012-10-18 ENCOUNTER — Emergency Department (HOSPITAL_COMMUNITY): Payer: Non-veteran care

## 2012-10-18 DIAGNOSIS — E876 Hypokalemia: Secondary | ICD-10-CM | POA: Diagnosis present

## 2012-10-18 DIAGNOSIS — N289 Disorder of kidney and ureter, unspecified: Secondary | ICD-10-CM | POA: Insufficient documentation

## 2012-10-18 DIAGNOSIS — C8589 Other specified types of non-Hodgkin lymphoma, extranodal and solid organ sites: Secondary | ICD-10-CM | POA: Insufficient documentation

## 2012-10-18 DIAGNOSIS — M79609 Pain in unspecified limb: Secondary | ICD-10-CM | POA: Insufficient documentation

## 2012-10-18 DIAGNOSIS — G8929 Other chronic pain: Secondary | ICD-10-CM | POA: Insufficient documentation

## 2012-10-18 DIAGNOSIS — IMO0002 Reserved for concepts with insufficient information to code with codable children: Secondary | ICD-10-CM | POA: Diagnosis present

## 2012-10-18 DIAGNOSIS — E860 Dehydration: Secondary | ICD-10-CM

## 2012-10-18 DIAGNOSIS — R112 Nausea with vomiting, unspecified: Secondary | ICD-10-CM | POA: Insufficient documentation

## 2012-10-18 DIAGNOSIS — F4310 Post-traumatic stress disorder, unspecified: Secondary | ICD-10-CM

## 2012-10-18 DIAGNOSIS — R079 Chest pain, unspecified: Principal | ICD-10-CM | POA: Insufficient documentation

## 2012-10-18 DIAGNOSIS — S069X9A Unspecified intracranial injury with loss of consciousness of unspecified duration, initial encounter: Secondary | ICD-10-CM

## 2012-10-18 DIAGNOSIS — Z87820 Personal history of traumatic brain injury: Secondary | ICD-10-CM | POA: Insufficient documentation

## 2012-10-18 DIAGNOSIS — R7989 Other specified abnormal findings of blood chemistry: Secondary | ICD-10-CM

## 2012-10-18 DIAGNOSIS — Z79899 Other long term (current) drug therapy: Secondary | ICD-10-CM | POA: Insufficient documentation

## 2012-10-18 DIAGNOSIS — R1013 Epigastric pain: Secondary | ICD-10-CM

## 2012-10-18 LAB — COMPREHENSIVE METABOLIC PANEL
ALT: 19 U/L (ref 0–53)
AST: 23 U/L (ref 0–37)
Alkaline Phosphatase: 68 U/L (ref 39–117)
CO2: 30 mEq/L (ref 19–32)
Calcium: 9.5 mg/dL (ref 8.4–10.5)
Chloride: 94 mEq/L — ABNORMAL LOW (ref 96–112)
GFR calc Af Amer: >90 mL/min (ref >90)
GFR calc non Af Amer: >90 mL/min (ref >90)
Glucose, Bld: 124 mg/dL — ABNORMAL HIGH (ref 70–99)
Potassium: 3.4 mEq/L — ABNORMAL LOW (ref 3.5–5.1)
Sodium: 134 mEq/L — ABNORMAL LOW (ref 135–145)
Total Bilirubin: 0.4 mg/dL (ref 0.3–1.2)

## 2012-10-18 LAB — CBC WITH DIFFERENTIAL/PLATELET
Basophils Absolute: 0 10*3/uL (ref 0.0–0.1)
Eosinophils Relative: 2 % (ref 0–5)
Lymphocytes Relative: 12 % (ref 12–46)
Lymphs Abs: 0.7 10*3/uL (ref 0.7–4.0)
MCV: 101.9 fL — ABNORMAL HIGH (ref 78.0–100.0)
Neutro Abs: 4.6 10*3/uL (ref 1.7–7.7)
Platelets: 178 10*3/uL (ref 150–400)
RBC: 4.11 MIL/uL — ABNORMAL LOW (ref 4.22–5.81)
RDW: 14.5 % (ref 11.5–15.5)
WBC: 5.7 10*3/uL (ref 4.0–10.5)

## 2012-10-18 MED ORDER — ONDANSETRON HCL 4 MG/2ML IJ SOLN
4.0000 mg | Freq: Once | INTRAMUSCULAR | Status: AC
  Administered 2012-10-18: 4 mg via INTRAVENOUS
  Filled 2012-10-18: qty 2

## 2012-10-18 MED ORDER — SODIUM CHLORIDE 0.9 % IV BOLUS (SEPSIS)
1000.0000 mL | Freq: Once | INTRAVENOUS | Status: AC
  Administered 2012-10-18: 1000 mL via INTRAVENOUS

## 2012-10-18 MED ORDER — QUETIAPINE FUMARATE ER 400 MG PO TB24: 400.0000 mg | ORAL_TABLET | Freq: Every day | ORAL | Status: DC

## 2012-10-18 MED ORDER — LORAZEPAM 2 MG/ML IJ SOLN
1.0000 mg | Freq: Once | INTRAMUSCULAR | Status: AC
  Administered 2012-10-18: 1 mg via INTRAVENOUS
  Filled 2012-10-18: qty 1

## 2012-10-18 MED ORDER — HYDROXYZINE HCL 25 MG PO TABS: 50.0000 mg | ORAL_TABLET | Freq: Four times a day (QID) | ORAL | Status: DC | PRN

## 2012-10-18 MED ORDER — SODIUM CHLORIDE 0.9 % IV SOLN
Freq: Once | INTRAVENOUS | Status: AC
Start: 2012-10-18 — End: 2012-10-19
  Administered 2012-10-19: via INTRAVENOUS

## 2012-10-18 MED ORDER — DIPHENHYDRAMINE HCL 50 MG/ML IJ SOLN
25.0000 mg | Freq: Once | INTRAMUSCULAR | Status: AC
  Administered 2012-10-18: 25 mg via INTRAVENOUS
  Filled 2012-10-18: qty 1

## 2012-10-18 NOTE — ED Notes (Signed)
Info called and faxed to [facility]

## 2012-10-18 NOTE — ED Notes (Signed)
Pt up to bathroom.

## 2012-10-18 NOTE — ED Notes (Signed)
Mom called in and was very rude. Writer told her that she could only confirm that pt was here without his consent. She cut writer off mid-sentence and said she has power of attorney and read his chart before she calls back then she hung up the phone.

## 2012-10-18 NOTE — ED Notes (Signed)
Dr Patria Mane wants telepsych repeated. State to consider capacity, disposition and whether or not pt still needs inpatient treatment.

## 2012-10-18 NOTE — ED Notes (Signed)
He is also c/o chest and leg pain

## 2012-10-18 NOTE — BHH Counselor (Signed)
Per Dr. Patria Mane patient's symptoms appear to have improved. Telepsych will need to be initiated to see if inpatient treatment is still needed.   Declined by [facility] for their mental health unit b/c of patient's medical acuity. Per Ray at the [facility] pt is a candidate for their medical unit and will be treated for both medical and mental health.  Faxed all updated labs, clinicals, etc. to [facility] 10/18/12 @ 0921. On-coming staff will need to follow up.   Declined at [facility] due to medical acuity and TBI  Administrative Decline at [facility] due to TBI.
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
## 2012-10-18 NOTE — ED Notes (Signed)
Pt's mom called back. Writer unable to locate POA in the chart. Asked pt for verbal permission to talk to his mom about his care and it was granted. Told mom Clinical research associate had to wake him up for verbal permission and as far as we can tell he's been referred to the [place] in [place] but we haven't heard back from them as of yet. Pt actually isn't on the new report received by the [facility] team. Assured mom that if IVC papers run out and pt still needs inpatient admission that's been recommended by the Telepsych MD we would renew his papers and keep him here until placement is found for further treatment. Mom asked that we tell pt that she loves him, is working with the [place] and is over at [facility] with his Olene Floss but he can call her on her cell if he wishes.

## 2012-10-18 NOTE — ED Provider Notes (Addendum)
8:16 AM Seen and evaluated by psychiatry who recommends inpatient treatment. No complaints today. Would like to go home  Filed Vitals:   10/18/12 0419  BP: 108/63  Pulse: 72  Temp: 97.7 F (36.5 C)  Resp: 18   8:54 AM Will ask telepsych to revevaluate the pt given 48 hours in the ER. Has been on new meds. May have stabilized in the ER as he has slept more in the past 2 days. Will ask psychiatry to evaluate his capacity as well  Lyanne Co, MD 10/18/12 9604  Lyanne Co, MD 10/18/12 0855  1:45 PM The psychiatrist believes that the patient is now stabilized from a mental health standpoint.  He recommends continuing the cerebral 400 mg at night and Vistaril 50 mg by mouth every 6 hours when necessary anxiety.  He recommends discontinuing his Adderall.  Please see the psychiatry note for complete details (Dr Rob Bunting. MD)  Lyanne Co, MD 10/18/12 1346

## 2012-10-18 NOTE — ED Notes (Signed)
Pt called mom to let her know he's discharging home per MD orders. She's at his house getting his clothes and will be her in about 20 minutes.

## 2012-10-18 NOTE — ED Notes (Signed)
Pt up using the phone talking to his mom

## 2012-10-18 NOTE — ED Notes (Signed)
Mom is here at bedside and wants to participate in Telepsych.

## 2012-10-18 NOTE — ED Notes (Signed)
The pt has been ill for 2 weeks intermittently.  He has cancer lymphoma.  He has a power port lt chest.  He has had vomiting  With low potassium for 2 weeks he has been to the eds several times.

## 2012-10-18 NOTE — ED Notes (Signed)
Dr campos into see 

## 2012-10-19 ENCOUNTER — Observation Stay (HOSPITAL_COMMUNITY): Payer: Non-veteran care

## 2012-10-19 DIAGNOSIS — R6889 Other general symptoms and signs: Secondary | ICD-10-CM

## 2012-10-19 DIAGNOSIS — S069X9A Unspecified intracranial injury with loss of consciousness of unspecified duration, initial encounter: Secondary | ICD-10-CM | POA: Diagnosis present

## 2012-10-19 DIAGNOSIS — R1013 Epigastric pain: Secondary | ICD-10-CM | POA: Diagnosis present

## 2012-10-19 DIAGNOSIS — F4310 Post-traumatic stress disorder, unspecified: Secondary | ICD-10-CM | POA: Diagnosis present

## 2012-10-19 LAB — URINALYSIS, ROUTINE W REFLEX MICROSCOPIC
Bilirubin Urine: NEGATIVE
Hgb urine dipstick: NEGATIVE
Ketones, ur: NEGATIVE mg/dL
Nitrite: NEGATIVE
Specific Gravity, Urine: 1.015 (ref 1.005–1.030)
Urobilinogen, UA: 1 mg/dL (ref 0.0–1.0)

## 2012-10-19 LAB — CBC
Hemoglobin: 12.1 g/dL — ABNORMAL LOW (ref 13.0–17.0)
MCH: 31.4 pg (ref 26.0–34.0)
MCHC: 36.3 g/dL — ABNORMAL HIGH (ref 30.0–36.0)
MCV: 86.5 fL (ref 78.0–100.0)
Platelets: 173 10*3/uL (ref 150–400)
RBC: 3.85 MIL/uL — ABNORMAL LOW (ref 4.22–5.81)

## 2012-10-19 LAB — COMPREHENSIVE METABOLIC PANEL
ALT: 16 U/L (ref 0–53)
Alkaline Phosphatase: 57 U/L (ref 39–117)
CO2: 28 mEq/L (ref 19–32)
GFR calc Af Amer: >90 mL/min (ref >90)
GFR calc non Af Amer: >90 mL/min (ref >90)
Glucose, Bld: 95 mg/dL (ref 70–99)
Potassium: 3.7 mEq/L (ref 3.5–5.1)
Sodium: 142 mEq/L (ref 135–145)

## 2012-10-19 LAB — RAPID URINE DRUG SCREEN, HOSP PERFORMED
Amphetamines: NOT DETECTED
Barbiturates: NOT DETECTED
Opiates: NOT DETECTED
Tetrahydrocannabinol: POSITIVE — AB

## 2012-10-19 MED ORDER — NICOTINE 21 MG/24HR TD PT24
21.0000 mg | MEDICATED_PATCH | Freq: Every day | TRANSDERMAL | Status: DC
  Administered 2012-10-19 – 2012-10-20 (×2): 21 mg via TRANSDERMAL
  Filled 2012-10-19 (×2): qty 1

## 2012-10-19 MED ORDER — OXYCODONE HCL 5 MG PO TABS
10.0000 mg | ORAL_TABLET | ORAL | Status: DC | PRN
  Administered 2012-10-19: 10 mg via ORAL
  Filled 2012-10-19: qty 2

## 2012-10-19 MED ORDER — QUETIAPINE FUMARATE ER 400 MG PO TB24
400.0000 mg | ORAL_TABLET | Freq: Every day | ORAL | Status: DC
  Administered 2012-10-19: 400 mg via ORAL
  Filled 2012-10-19 (×3): qty 1

## 2012-10-19 MED ORDER — SODIUM CHLORIDE 0.9 % IV SOLN
INTRAVENOUS | Status: DC
  Administered 2012-10-19 – 2012-10-20 (×2): via INTRAVENOUS

## 2012-10-19 MED ORDER — ALUM & MAG HYDROXIDE-SIMETH 200-200-20 MG/5ML PO SUSP
30.0000 mL | Freq: Four times a day (QID) | ORAL | Status: DC | PRN
  Administered 2012-10-19: 30 mL via ORAL
  Filled 2012-10-19 (×2): qty 30

## 2012-10-19 MED ORDER — ONDANSETRON HCL 4 MG/2ML IJ SOLN: 4.0000 mg | Freq: Three times a day (TID) | INTRAMUSCULAR | Status: DC | PRN

## 2012-10-19 MED ORDER — LORAZEPAM 2 MG/ML IJ SOLN
1.0000 mg | Freq: Once | INTRAMUSCULAR | Status: AC
  Administered 2012-10-19: 1 mg via INTRAVENOUS
  Filled 2012-10-19: qty 1

## 2012-10-19 MED ORDER — HYDROMORPHONE HCL PF 1 MG/ML IJ SOLN
1.0000 mg | INTRAMUSCULAR | Status: DC | PRN
  Administered 2012-10-19: 1 mg via INTRAVENOUS

## 2012-10-19 MED ORDER — HYDROMORPHONE HCL PF 1 MG/ML IJ SOLN
1.0000 mg | INTRAMUSCULAR | Status: DC | PRN
  Administered 2012-10-19 (×2): 1 mg via INTRAVENOUS
  Filled 2012-10-19 (×4): qty 1

## 2012-10-19 MED ORDER — ONDANSETRON HCL 4 MG/2ML IJ SOLN
4.0000 mg | Freq: Four times a day (QID) | INTRAMUSCULAR | Status: DC | PRN
  Administered 2012-10-19 – 2012-10-20 (×3): 4 mg via INTRAVENOUS
  Filled 2012-10-19 (×3): qty 2

## 2012-10-19 MED ORDER — OXYCODONE HCL 5 MG PO TABS
15.0000 mg | ORAL_TABLET | ORAL | Status: DC | PRN
  Administered 2012-10-19 – 2012-10-20 (×5): 15 mg via ORAL
  Filled 2012-10-19 (×5): qty 3

## 2012-10-19 MED ORDER — PANTOPRAZOLE SODIUM 40 MG IV SOLR
40.0000 mg | INTRAVENOUS | Status: DC
  Administered 2012-10-19 – 2012-10-20 (×2): 40 mg via INTRAVENOUS
  Filled 2012-10-19 (×2): qty 40

## 2012-10-19 MED ORDER — SODIUM CHLORIDE 0.9 % IJ SOLN
10.0000 mL | INTRAMUSCULAR | Status: DC | PRN
  Administered 2012-10-19 – 2012-10-20 (×2): 10 mL

## 2012-10-19 MED ORDER — POTASSIUM CHLORIDE IN NACL 20-0.9 MEQ/L-% IV SOLN
INTRAVENOUS | Status: AC
  Administered 2012-10-19 (×2): via INTRAVENOUS
  Filled 2012-10-19 (×2): qty 1000

## 2012-10-19 MED ORDER — SODIUM CHLORIDE 0.9 % IV SOLN: INTRAVENOUS | Status: AC

## 2012-10-19 MED ORDER — HYDROXYZINE HCL 25 MG PO TABS: 50.0000 mg | ORAL_TABLET | Freq: Four times a day (QID) | ORAL | Status: DC | PRN

## 2012-10-19 MED ORDER — LORAZEPAM 2 MG/ML IJ SOLN
1.0000 mg | Freq: Four times a day (QID) | INTRAMUSCULAR | Status: DC | PRN
  Administered 2012-10-19 – 2012-10-20 (×5): 1 mg via INTRAVENOUS
  Filled 2012-10-19 (×5): qty 1

## 2012-10-19 MED ORDER — ONDANSETRON HCL 4 MG PO TABS: 4.0000 mg | ORAL_TABLET | Freq: Four times a day (QID) | ORAL | Status: DC | PRN

## 2012-10-19 MED ORDER — ZOLPIDEM TARTRATE 5 MG PO TABS
10.0000 mg | ORAL_TABLET | Freq: Every evening | ORAL | Status: DC | PRN
  Administered 2012-10-19 (×2): 10 mg via ORAL
  Filled 2012-10-19 (×2): qty 2
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
## 2012-10-19 NOTE — Progress Notes (Signed)
Was called into pt's room, he is increasingly anxious and upset about the lack of pain medicine he is receiving. Pt rates his pain at 6/10, in both his legs and abdomen. Pt states that he typically takes PO Dilaudid and Xanax around the clock at home to manage his pain and anxiety symptoms. MD notified. Orders recevied. Pt's mom would also like to discuss his medications with Dr. Jomarie Longs - she is aware, and will plan to call pt's mom today.

## 2012-10-19 NOTE — Progress Notes (Signed)
Pt seen and examined Admitted this am with N/V/abd pain esp after eating No vomiting since admission USG negative, abd exam benign Will check HIDA, advance diet, supportive care, PPI Transition to PO narcotics  Zannie Cove, MD [phone]

## 2012-10-20 ENCOUNTER — Observation Stay (HOSPITAL_COMMUNITY): Payer: Non-veteran care

## 2012-10-20 ENCOUNTER — Other Ambulatory Visit: Payer: Non-veteran care | Admitting: Lab

## 2012-10-20 DIAGNOSIS — R52 Pain, unspecified: Secondary | ICD-10-CM

## 2012-10-20 DIAGNOSIS — R1115 Cyclical vomiting syndrome unrelated to migraine: Secondary | ICD-10-CM

## 2012-10-20 DIAGNOSIS — R1013 Epigastric pain: Secondary | ICD-10-CM

## 2012-10-20 DIAGNOSIS — IMO0002 Reserved for concepts with insufficient information to code with codable children: Secondary | ICD-10-CM

## 2012-10-20 MED ORDER — TECHNETIUM TC 99M MEBROFENIN IV KIT
5.0000 | PACK | Freq: Once | INTRAVENOUS | Status: AC | PRN
  Administered 2012-10-20: 5 via INTRAVENOUS

## 2012-10-20 MED ORDER — HEPARIN SOD (PORK) LOCK FLUSH 100 UNIT/ML IV SOLN
500.0000 [IU] | INTRAVENOUS | Status: DC | PRN
  Administered 2012-10-20: 500 [IU]
  Filled 2012-10-20: qty 5

## 2012-10-20 MED ORDER — PANTOPRAZOLE SODIUM 40 MG PO TBEC: 40.0000 mg | DELAYED_RELEASE_TABLET | Freq: Every day | ORAL | Status: DC

## 2012-10-20 MED ORDER — SINCALIDE 5 MCG IJ SOLR
0.0200 ug/kg | Freq: Once | INTRAMUSCULAR | Status: AC
  Administered 2012-10-20: 1.3 ug via INTRAVENOUS

## 2012-10-20 MED ORDER — OXYCODONE HCL 15 MG PO TABS: 15.0000 mg | ORAL_TABLET | ORAL | Status: DC | PRN

## 2012-10-20 MED ORDER — PROMETHAZINE HCL 25 MG PO TABS: 25.0000 mg | ORAL_TABLET | Freq: Four times a day (QID) | ORAL | Status: DC | PRN

## 2012-10-20 MED ORDER — ALPRAZOLAM 0.5 MG PO TABS: 0.5000 mg | ORAL_TABLET | Freq: Two times a day (BID) | ORAL | Status: DC | PRN

## 2012-10-20 MED ORDER — QUETIAPINE FUMARATE ER 400 MG PO TB24: 400.0000 mg | ORAL_TABLET | Freq: Every day | ORAL | Status: DC

## 2012-10-20 MED ORDER — HEPARIN SOD (PORK) LOCK FLUSH 100 UNIT/ML IV SOLN
500.0000 [IU] | INTRAVENOUS | Status: DC
  Filled 2012-10-20: qty 5

## 2012-10-20 MED ORDER — ZOLPIDEM TARTRATE 10 MG PO TABS: 10.0000 mg | ORAL_TABLET | Freq: Every evening | ORAL | Status: DC | PRN

## 2012-10-20 MED ORDER — ADULT MULTIVITAMIN W/MINERALS CH
1.0000 | ORAL_TABLET | Freq: Every day | ORAL | Status: DC
  Administered 2012-10-20: 1 via ORAL
  Filled 2012-10-20: qty 1
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
## 2012-10-20 NOTE — Progress Notes (Signed)
Discussed discharge instructions and prescriptions with pt. Pt showed no barriers to discharge. Assessment unchanged from morning. Awaiting IV team to de-access porta cath. Pt to be discharged home with mother.

## 2012-10-20 NOTE — Progress Notes (Signed)
MD wants pt to receive outpatient psyc counseling, and outpatient dietitian services after discharge. I spoke with the Case Manager, and she recommended that the pt contact the [place] [facility] in [place]. I provided the pt with the phone number [phone]). Unfortunately, they are closed today, due to Veteran's Day, so we are unable to set up these appointments at this time.

## 2012-10-31 ENCOUNTER — Encounter (HOSPITAL_COMMUNITY): Payer: Self-pay | Admitting: *Deleted

## 2012-10-31 ENCOUNTER — Emergency Department (HOSPITAL_COMMUNITY)
Admission: EM | Admit: 2012-10-31 | Discharge: 2012-10-31 | Disposition: A | Discharge status: Home / Self Care | Attending: Emergency Medicine | Admitting: Emergency Medicine

## 2012-10-31 DIAGNOSIS — Z87828 Personal history of other (healed) physical injury and trauma: Secondary | ICD-10-CM | POA: Insufficient documentation

## 2012-10-31 DIAGNOSIS — R112 Nausea with vomiting, unspecified: Secondary | ICD-10-CM | POA: Insufficient documentation

## 2012-10-31 DIAGNOSIS — Z87892 Personal history of anaphylaxis: Secondary | ICD-10-CM | POA: Insufficient documentation

## 2012-10-31 DIAGNOSIS — Z87891 Personal history of nicotine dependence: Secondary | ICD-10-CM | POA: Insufficient documentation

## 2012-10-31 DIAGNOSIS — Z87448 Personal history of other diseases of urinary system: Secondary | ICD-10-CM | POA: Insufficient documentation

## 2012-10-31 DIAGNOSIS — R52 Pain, unspecified: Secondary | ICD-10-CM | POA: Insufficient documentation

## 2012-10-31 DIAGNOSIS — F4310 Post-traumatic stress disorder, unspecified: Secondary | ICD-10-CM | POA: Insufficient documentation

## 2012-10-31 DIAGNOSIS — Z859 Personal history of malignant neoplasm, unspecified: Secondary | ICD-10-CM | POA: Insufficient documentation

## 2012-10-31 DIAGNOSIS — Z9283 Personal history of failed moderate sedation: Secondary | ICD-10-CM | POA: Insufficient documentation

## 2012-10-31 DIAGNOSIS — Z87820 Personal history of traumatic brain injury: Secondary | ICD-10-CM | POA: Insufficient documentation

## 2012-10-31 LAB — HEPATIC FUNCTION PANEL
Bilirubin, Direct: 0.1 mg/dL (ref 0.0–0.3)
Indirect Bilirubin: 0.4 mg/dL (ref 0.3–0.9)
Total Bilirubin: 0.5 mg/dL (ref 0.3–1.2)

## 2012-10-31 LAB — BASIC METABOLIC PANEL
BUN: 13 mg/dL (ref 6–23)
Calcium: 10.4 mg/dL (ref 8.4–10.5)
Creatinine, Ser: 0.91 mg/dL (ref 0.50–1.35)
GFR calc non Af Amer: >90 mL/min (ref >90)
Glucose, Bld: 111 mg/dL — ABNORMAL HIGH (ref 70–99)

## 2012-10-31 LAB — CBC
HCT: 42.6 % (ref 39.0–52.0)
Hemoglobin: 15.2 g/dL (ref 13.0–17.0)
MCH: 32.1 pg (ref 26.0–34.0)
MCHC: 35.7 g/dL (ref 30.0–36.0)
RDW: 12.2 % (ref 11.5–15.5)

## 2012-10-31 MED ORDER — ONDANSETRON 4 MG PO TBDP
ORAL_TABLET | ORAL | Status: AC
  Administered 2012-10-31: 4 mg
  Filled 2012-10-31: qty 1

## 2012-10-31 MED ORDER — ONDANSETRON HCL 4 MG/2ML IJ SOLN: 4.0000 mg | Freq: Once | INTRAMUSCULAR | Status: DC

## 2012-10-31 MED ORDER — POTASSIUM CHLORIDE CRYS ER 20 MEQ PO TBCR
40.0000 meq | EXTENDED_RELEASE_TABLET | Freq: Once | ORAL | Status: AC
  Administered 2012-10-31: 40 meq via ORAL
  Filled 2012-10-31: qty 2

## 2012-10-31 MED ORDER — ONDANSETRON 8 MG PO TBDP: 8.0000 mg | ORAL_TABLET | Freq: Three times a day (TID) | ORAL | Status: DC | PRN

## 2012-10-31 MED ORDER — POTASSIUM CHLORIDE CRYS ER 20 MEQ PO TBCR: 20.0000 meq | EXTENDED_RELEASE_TABLET | Freq: Two times a day (BID) | ORAL | Status: DC

## 2012-10-31 MED ORDER — ONDANSETRON HCL 8 MG PO TABS: 4.0000 mg | ORAL_TABLET | Freq: Once | ORAL | Status: DC

## 2012-10-31 MED ORDER — SODIUM CHLORIDE 0.9 % IV BOLUS (SEPSIS)
1000.0000 mL | Freq: Once | INTRAVENOUS | Status: AC
  Administered 2012-10-31: 1000 mL via INTRAVENOUS

## 2012-10-31 MED ORDER — ONDANSETRON 4 MG PO TBDP
ORAL_TABLET | ORAL | Status: AC
  Filled 2012-10-31: qty 1
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
## 2012-10-31 NOTE — ED Notes (Addendum)
Pt in c/o abd pain and n/v, pain to LUQ and RUQ x4 days. Pt has a history of similar episodes like this and has been seen for same in the past. Pt recently finished chemo for lymphoma, last treatment was four weeks ago.

## 2012-10-31 NOTE — ED Notes (Signed)
Patient has ALCL, lymphoma.  Patient is in remission now.  He said since the beginning of November he has had these episodes of severe abdominal pain accompanied by nausea and vomiting.  Patient said he was hospitalized and discharged last wednesday for the same symptoms.  He said they ran some tests and all was normal.  This episode started two days ago, and the patient cannot keep anything down. Patient's mom, Ediberto Sens is at the bedside.

## 2012-10-31 NOTE — ED Notes (Signed)
Patient is alert and orientedx4.  Patient was explained discharge instructions and he had no questions.  Patient's mother is transporting patient home.

## 2012-10-31 NOTE — ED Notes (Signed)
Patient unable to void at this time
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
## 2012-11-26 ENCOUNTER — Other Ambulatory Visit: Payer: Non-veteran care | Admitting: Lab

## 2012-11-26 ENCOUNTER — Ambulatory Visit: Payer: Non-veteran care | Admitting: Hematology & Oncology

## 2013-09-02 ENCOUNTER — Telehealth: Payer: Self-pay | Admitting: Hematology & Oncology

## 2013-09-02 NOTE — Telephone Encounter (Signed)
Received auth from [place] they want pt to follow up here. Home phone says mailbox full and cell won't go through

## 2013-09-02 NOTE — Telephone Encounter (Signed)
Left message for [facility] case worker to call we received auth for pt but he doesn't have any appointments scheduled

## 2013-09-03 ENCOUNTER — Telehealth: Payer: Self-pay | Admitting: Hematology & Oncology

## 2013-09-03 ENCOUNTER — Encounter: Payer: Self-pay | Admitting: Hematology & Oncology

## 2013-09-03 NOTE — Telephone Encounter (Signed)
Mailed letter to home address in chart and another to address listed on [place] referral, for pt to call and schedule appointment

## 2013-09-04 ENCOUNTER — Telehealth: Payer: Self-pay | Admitting: Hematology & Oncology

## 2013-09-04 NOTE — Telephone Encounter (Addendum)
HEME/ONC VISIT APPROVED VALID: 08/28/2013 - 02/26/2014 AUTH: [id number]   COPY SCANNED

## 2014-01-14 ENCOUNTER — Telehealth: Payer: Self-pay | Admitting: Hematology & Oncology

## 2014-01-14 NOTE — Telephone Encounter (Signed)
Pt called made 2-10 to see MD wants port taken out said [facility] told him he would have to go thru [place]. I left message with Anderson Malta at the [place] to get authorized.

## 2014-01-15 ENCOUNTER — Telehealth: Payer: Self-pay | Admitting: Hematology & Oncology

## 2014-01-15 NOTE — Telephone Encounter (Signed)
[facility] APPROVED  AUTH: [id number] DATES: 08/28/2013 - 02/26/2014   3 F/U  3 LABS    COPY SCANNED

## 2014-01-19 ENCOUNTER — Encounter: Payer: Self-pay | Admitting: Hematology & Oncology

## 2014-01-19 ENCOUNTER — Other Ambulatory Visit: Payer: Self-pay | Admitting: Nurse Practitioner

## 2014-01-19 ENCOUNTER — Other Ambulatory Visit (HOSPITAL_BASED_OUTPATIENT_CLINIC_OR_DEPARTMENT_OTHER): Payer: Non-veteran care | Admitting: Lab

## 2014-01-19 ENCOUNTER — Ambulatory Visit (HOSPITAL_BASED_OUTPATIENT_CLINIC_OR_DEPARTMENT_OTHER): Payer: Non-veteran care | Admitting: Hematology & Oncology

## 2014-01-19 ENCOUNTER — Telehealth: Payer: Self-pay | Admitting: Hematology & Oncology

## 2014-01-19 VITALS — BP 134/49 | HR 75 | Temp 97.4°F | Resp 18 | Ht 72.0 in | Wt 154.0 lb

## 2014-01-19 DIAGNOSIS — C8590 Non-Hodgkin lymphoma, unspecified, unspecified site: Secondary | ICD-10-CM

## 2014-01-19 DIAGNOSIS — IMO0002 Reserved for concepts with insufficient information to code with codable children: Secondary | ICD-10-CM

## 2014-01-19 DIAGNOSIS — F4310 Post-traumatic stress disorder, unspecified: Secondary | ICD-10-CM

## 2014-01-19 LAB — COMPREHENSIVE METABOLIC PANEL
ALBUMIN: 4.5 g/dL (ref 3.5–5.2)
ALT: 20 U/L (ref 0–53)
AST: 20 U/L (ref 0–37)
Alkaline Phosphatase: 58 U/L (ref 39–117)
BUN: 12 mg/dL (ref 6–23)
CALCIUM: 9.4 mg/dL (ref 8.4–10.5)
CHLORIDE: 103 meq/L (ref 96–112)
CO2: 29 meq/L (ref 19–32)
Creatinine, Ser: 0.92 mg/dL (ref 0.50–1.35)
GLUCOSE: 87 mg/dL (ref 70–99)
POTASSIUM: 4.5 meq/L (ref 3.5–5.3)
Sodium: 139 mEq/L (ref 135–145)
Total Bilirubin: 0.4 mg/dL (ref 0.2–1.2)
Total Protein: 6.9 g/dL (ref 6.0–8.3)

## 2014-01-19 LAB — CBC WITH DIFFERENTIAL (CANCER CENTER ONLY)
BASO#: 0 10*3/uL (ref 0.0–0.2)
BASO%: 0.2 % (ref 0.0–2.0)
EOS ABS: 0.2 10*3/uL (ref 0.0–0.5)
EOS%: 3.2 % (ref 0.0–7.0)
HCT: 44.2 % (ref 38.7–49.9)
HEMOGLOBIN: 15.2 g/dL (ref 13.0–17.1)
LYMPH#: 0.8 10*3/uL — AB (ref 0.9–3.3)
LYMPH%: 13.1 % — ABNORMAL LOW (ref 14.0–48.0)
MCH: 31.1 pg (ref 28.0–33.4)
MCHC: 34.4 g/dL (ref 32.0–35.9)
MCV: 90 fL (ref 82–98)
MONO#: 0.5 10*3/uL (ref 0.1–0.9)
MONO%: 7.7 % (ref 0.0–13.0)
NEUT%: 75.8 % (ref 40.0–80.0)
NEUTROS ABS: 4.8 10*3/uL (ref 1.5–6.5)
Platelets: 199 10*3/uL (ref 145–400)
RBC: 4.89 10*6/uL (ref 4.20–5.70)
RDW: 12.9 % (ref 11.1–15.7)
WBC: 6.3 10*3/uL (ref 4.0–10.0)

## 2014-01-19 LAB — LACTATE DEHYDROGENASE: LDH: 133 U/L (ref 94–250)

## 2014-01-19 NOTE — Patient Instructions (Addendum)
Smoking Cessation, Tips for Success If you are ready to quit smoking, congratulations! You have chosen to help yourself be healthier. Cigarettes bring nicotine, tar, carbon monoxide, and other irritants into your body. Your lungs, heart, and blood vessels will be able to work better without these poisons. There are many different ways to quit smoking. Nicotine gum, nicotine patches, a nicotine inhaler, or nicotine nasal spray can help with physical craving. Hypnosis, support groups, and medicines help break the habit of smoking. WHAT THINGS CAN I DO TO MAKE QUITTING EASIER?  Here are some tips to help you quit for good:  Pick a date when you will quit smoking completely. Tell all of your friends and family about your plan to quit on that date.  Do not try to slowly cut down on the number of cigarettes you are smoking. Pick a quit date and quit smoking completely starting on that day.  Throw away all cigarettes.   Clean and remove all ashtrays from your home, work, and car.   On a card, write down your reasons for quitting. Carry the card with you and read it when you get the urge to smoke.   Cleanse your body of nicotine. Drink enough water and fluids to keep your urine clear or pale yellow. Do this after quitting to flush the nicotine from your body.   Learn to predict your moods. Do not let a bad situation be your excuse to have a cigarette. Some situations in your life might tempt you into wanting a cigarette.   Never have "just one" cigarette. It leads to wanting another and another. Remind yourself of your decision to quit.   Change habits associated with smoking. If you smoked while driving or when feeling stressed, try other activities to replace smoking. Stand up when drinking your coffee. Brush your teeth after eating. Sit in a different chair when you read the paper. Avoid alcohol while trying to quit, and try to drink fewer caffeinated beverages. Alcohol and caffeine may urge  you to smoke.   Avoid foods and drinks that can trigger a desire to smoke, such as sugary or spicy foods and alcohol.   Ask people who smoke not to smoke around you.   Have something planned to do right after eating or having a cup of coffee. For example, plan to take a walk or exercise.   Try a relaxation exercise to calm you down and decrease your stress. Remember, you may be tense and nervous for the first 2 weeks after you quit, but this will pass.   Find new activities to keep your hands busy. Play with a pen, coin, or rubber band. Doodle or draw things on paper.   Brush your teeth right after eating. This will help cut down on the craving for the taste of tobacco after meals. You can also try mouthwash.   Use oral substitutes in place of cigarettes. Try using lemon drops, carrots, cinnamon sticks, or chewing gum. Keep them handy so they are available when you have the urge to smoke.   When you have the urge to smoke, try deep breathing.   Designate your home as a nonsmoking area.   If you are a heavy smoker, ask your health care provider about a prescription for nicotine chewing gum. It can ease your withdrawal from nicotine.   Reward yourself. Set aside the cigarette money you save and buy yourself something nice.   Look for support from others. Join a support group or   smoking cessation program. Ask someone at home or at work to help you with your plan to quit smoking.   Always ask yourself, "Do I need this cigarette or is this just a reflex?" Tell yourself, "Today, I choose not to smoke," or "I do not want to smoke." You are reminding yourself of your decision to quit.  Do not replace cigarette smoking with electronic cigarettes (commonly called e-cigarettes). The safety of e-cigarettes is unknown, and some may contain harmful chemicals.  If you relapse, do not give up! Plan ahead and think about what you will do the next time you get the urge to smoke.  HOW WILL  I FEEL WHEN I QUIT SMOKING? You may have symptoms of withdrawal because your body is used to nicotine (the addictive substance in cigarettes). You may crave cigarettes, be irritable, feel very hungry, cough often, get headaches, or have difficulty concentrating. The withdrawal symptoms are only temporary. They are strongest when you first quit but will go away within 10 14 days. When withdrawal symptoms occur, stay in control. Think about your reasons for quitting. Remind yourself that these are signs that your body is healing and getting used to being without cigarettes. Remember that withdrawal symptoms are easier to treat than the major diseases that smoking can cause.  Even after the withdrawal is over, expect periodic urges to smoke. However, these cravings are generally short lived and will go away whether you smoke or not. Do not smoke!  WHAT RESOURCES ARE AVAILABLE TO HELP ME QUIT SMOKING? Your health care provider can direct you to community resources or hospitals for support, which may include:  Group support.  Education.  Hypnosis.  Therapy. Document Released: 08/24/2004 Document Revised: 09/16/2013 Document Reviewed: 05/14/2013 [facility] Patient Information 2014 [place], [place]. You Can Quit Smoking If you are ready to quit smoking or are thinking about it, congratulations! You have chosen to help yourself be healthier and live longer! There are lots of different ways to quit smoking. Nicotine gum, nicotine patches, a nicotine inhaler, or nicotine nasal spray can help with physical craving. Hypnosis, support groups, and medicines help break the habit of smoking. TIPS TO GET OFF AND STAY OFF CIGARETTES  Learn to predict your moods. Do not let a bad situation be your excuse to have a cigarette. Some situations in your life might tempt you to have a cigarette.  Ask friends and co-workers not to smoke around you.  Make your home smoke-free.  Never have "just one" cigarette. It leads  to wanting another and another. Remind yourself of your decision to quit.  On a card, make a list of your reasons for not smoking. Read it at least the same number of times a day as you have a cigarette. Tell yourself everyday, "I do not want to smoke. I choose not to smoke."  Ask someone at home or work to help you with your plan to quit smoking.  Have something planned after you eat or have a cup of coffee. Take a walk or get other exercise to perk you up. This will help to keep you from overeating.  Try a relaxation exercise to calm you down and decrease your stress. Remember, you may be tense and nervous the first two weeks after you quit. This will pass.  Find new activities to keep your hands busy. Play with a pen, coin, or rubber band. Doodle or draw things on paper.  Brush your teeth right after eating. This will help cut down the  craving for the taste of tobacco after meals. You can try mouthwash too.  Try gum, breath mints, or diet candy to keep something in your mouth. IF YOU SMOKE AND WANT TO QUIT:  Do not stock up on cigarettes. Never buy a carton. Wait until one pack is finished before you buy another.  Never carry cigarettes with you at work or at home.  Keep cigarettes as far away from you as possible. Leave them with someone else.  Never carry matches or a lighter with you.  Ask yourself, "Do I need this cigarette or is this just a reflex?"  Bet with someone that you can quit. Put cigarette money in a piggy bank every morning. If you smoke, you give up the money. If you do not smoke, by the end of the week, you keep the money.  Keep trying. It takes 21 days to change a habit!  Talk to your doctor about using medicines to help you quit. These include nicotine replacement gum, lozenges, or skin patches. Document Released: 09/22/2009 Document Revised: 02/18/2012 Document Reviewed: 09/22/2009 [facility] Patient Information 2014 [place]. Smoking Cessation, Tips for  Success If you are ready to quit smoking, congratulations! You have chosen to help yourself be healthier. Cigarettes bring nicotine, tar, carbon monoxide, and other irritants into your body. Your lungs, heart, and blood vessels will be able to work better without these poisons. There are many different ways to quit smoking. Nicotine gum, nicotine patches, a nicotine inhaler, or nicotine nasal spray can help with physical craving. Hypnosis, support groups, and medicines help break the habit of smoking. WHAT THINGS CAN I DO TO MAKE QUITTING EASIER?  Here are some tips to help you quit for good:  Pick a date when you will quit smoking completely. Tell all of your friends and family about your plan to quit on that date.  Do not try to slowly cut down on the number of cigarettes you are smoking. Pick a quit date and quit smoking completely starting on that day.  Throw away all cigarettes.   Clean and remove all ashtrays from your home, work, and car.   On a card, write down your reasons for quitting. Carry the card with you and read it when you get the urge to smoke.   Cleanse your body of nicotine. Drink enough water and fluids to keep your urine clear or pale yellow. Do this after quitting to flush the nicotine from your body.   Learn to predict your moods. Do not let a bad situation be your excuse to have a cigarette. Some situations in your life might tempt you into wanting a cigarette.   Never have "just one" cigarette. It leads to wanting another and another. Remind yourself of your decision to quit.   Change habits associated with smoking. If you smoked while driving or when feeling stressed, try other activities to replace smoking. Stand up when drinking your coffee. Brush your teeth after eating. Sit in a different chair when you read the paper. Avoid alcohol while trying to quit, and try to drink fewer caffeinated beverages. Alcohol and caffeine may urge you to smoke.   Avoid foods  and drinks that can trigger a desire to smoke, such as sugary or spicy foods and alcohol.   Ask people who smoke not to smoke around you.   Have something planned to do right after eating or having a cup of coffee. For example, plan to take a walk or exercise.  Try a relaxation exercise to calm you down and decrease your stress. Remember, you may be tense and nervous for the first 2 weeks after you quit, but this will pass.   Find new activities to keep your hands busy. Play with a pen, coin, or rubber band. Doodle or draw things on paper.   Brush your teeth right after eating. This will help cut down on the craving for the taste of tobacco after meals. You can also try mouthwash.   Use oral substitutes in place of cigarettes. Try using lemon drops, carrots, cinnamon sticks, or chewing gum. Keep them handy so they are available when you have the urge to smoke.   When you have the urge to smoke, try deep breathing.   Designate your home as a nonsmoking area.   If you are a heavy smoker, ask your health care provider about a prescription for nicotine chewing gum. It can ease your withdrawal from nicotine.   Reward yourself. Set aside the cigarette money you save and buy yourself something nice.   Look for support from others. Join a support group or smoking cessation program. Ask someone at home or at work to help you with your plan to quit smoking.   Always ask yourself, "Do I need this cigarette or is this just a reflex?" Tell yourself, "Today, I choose not to smoke," or "I do not want to smoke." You are reminding yourself of your decision to quit.  Do not replace cigarette smoking with electronic cigarettes (commonly called e-cigarettes). The safety of e-cigarettes is unknown, and some may contain harmful chemicals.  If you relapse, do not give up! Plan ahead and think about what you will do the next time you get the urge to smoke.  HOW WILL I FEEL WHEN I QUIT  SMOKING? You may have symptoms of withdrawal because your body is used to nicotine (the addictive substance in cigarettes). You may crave cigarettes, be irritable, feel very hungry, cough often, get headaches, or have difficulty concentrating. The withdrawal symptoms are only temporary. They are strongest when you first quit but will go away within 10 14 days. When withdrawal symptoms occur, stay in control. Think about your reasons for quitting. Remind yourself that these are signs that your body is healing and getting used to being without cigarettes. Remember that withdrawal symptoms are easier to treat than the major diseases that smoking can cause.  Even after the withdrawal is over, expect periodic urges to smoke. However, these cravings are generally short lived and will go away whether you smoke or not. Do not smoke!  WHAT RESOURCES ARE AVAILABLE TO HELP ME QUIT SMOKING? Your health care provider can direct you to community resources or hospitals for support, which may include:  Group support.  Education.  Hypnosis.  Therapy. Document Released: 08/24/2004 Document Revised: 09/16/2013 Document Reviewed: 05/14/2013 [facility] Patient Information 2014 [place], [place]. Smoking Cessation Quitting smoking is important to your health and has many advantages. However, it is not always easy to quit since nicotine is a very addictive drug. Often times, people try 3 times or more before being able to quit. This document explains the best ways for you to prepare to quit smoking. Quitting takes hard work and a lot of effort, but you can do it. ADVANTAGES OF QUITTING SMOKING  You will live longer, feel better, and live better.  Your body will feel the impact of quitting smoking almost immediately.  Within 20 minutes, blood pressure decreases. Your pulse returns  to its normal level.  After 8 hours, carbon monoxide levels in the blood return to normal. Your oxygen level increases.  After 24 hours,  the chance of having a heart attack starts to decrease. Your breath, hair, and body stop smelling like smoke.  After 48 hours, damaged nerve endings begin to recover. Your sense of taste and smell improve.  After 72 hours, the body is virtually free of nicotine. Your bronchial tubes relax and breathing becomes easier.  After 2 to 12 weeks, lungs can hold more air. Exercise becomes easier and circulation improves.  The risk of having a heart attack, stroke, cancer, or lung disease is greatly reduced.  After 1 year, the risk of coronary heart disease is cut in half.  After 5 years, the risk of stroke falls to the same as a nonsmoker.  After 10 years, the risk of lung cancer is cut in half and the risk of other cancers decreases significantly.  After 15 years, the risk of coronary heart disease drops, usually to the level of a nonsmoker.  If you are pregnant, quitting smoking will improve your chances of having a healthy baby.  The people you live with, especially any children, will be healthier.  You will have extra money to spend on things other than cigarettes. QUESTIONS TO THINK ABOUT BEFORE ATTEMPTING TO QUIT You may want to talk about your answers with your caregiver.  Why do you want to quit?  If you tried to quit in the past, what helped and what did not?  What will be the most difficult situations for you after you quit? How will you plan to handle them?  Who can help you through the tough times? Your family? Friends? A caregiver?  What pleasures do you get from smoking? What ways can you still get pleasure if you quit? Here are some questions to ask your caregiver:  How can you help me to be successful at quitting?  What medicine do you think would be best for me and how should I take it?  What should I do if I need more help?  What is smoking withdrawal like? How can I get information on withdrawal? GET READY  Set a quit date.  Change your environment by  getting rid of all cigarettes, ashtrays, matches, and lighters in your home, car, or work. Do not let people smoke in your home.  Review your past attempts to quit. Think about what worked and what did not. GET SUPPORT AND ENCOURAGEMENT You have a better chance of being successful if you have help. You can get support in many ways.  Tell your family, friends, and co-workers that you are going to quit and need their support. Ask them not to smoke around you.  Get individual, group, or telephone counseling and support. Programs are available at [employer] and health centers. Call your local health department for information about programs in your area.  Spiritual beliefs and practices may help some smokers quit.  Download a "quit meter" on your computer to keep track of quit statistics, such as how long you have gone without smoking, cigarettes not smoked, and money saved.  Get a self-help book about quitting smoking and staying off of tobacco. LEARN NEW SKILLS AND BEHAVIORS  Distract yourself from urges to smoke. Talk to someone, go for a walk, or occupy your time with a task.  Change your normal routine. Take a different route to work. Drink tea instead of coffee. Eat breakfast in  a different place.  Reduce your stress. Take a hot bath, exercise, or read a book.  Plan something enjoyable to do every day. Reward yourself for not smoking.  Explore interactive web-based programs that specialize in helping you quit. GET MEDICINE AND USE IT CORRECTLY Medicines can help you stop smoking and decrease the urge to smoke. Combining medicine with the above behavioral methods and support can greatly increase your chances of successfully quitting smoking.  Nicotine replacement therapy helps deliver nicotine to your body without the negative effects and risks of smoking. Nicotine replacement therapy includes nicotine gum, lozenges, inhalers, nasal sprays, and skin patches. Some may be available  over-the-counter and others require a prescription.  Antidepressant medicine helps people abstain from smoking, but how this works is unknown. This medicine is available by prescription.  Nicotinic receptor partial agonist medicine simulates the effect of nicotine in your brain. This medicine is available by prescription. Ask your caregiver for advice about which medicines to use and how to use them based on your health history. Your caregiver will tell you what side effects to look out for if you choose to be on a medicine or therapy. Carefully read the information on the package. Do not use any other product containing nicotine while using a nicotine replacement product.  RELAPSE OR DIFFICULT SITUATIONS Most relapses occur within the first 3 months after quitting. Do not be discouraged if you start smoking again. Remember, most people try several times before finally quitting. You may have symptoms of withdrawal because your body is used to nicotine. You may crave cigarettes, be irritable, feel very hungry, cough often, get headaches, or have difficulty concentrating. The withdrawal symptoms are only temporary. They are strongest when you first quit, but they will go away within 10 14 days. To reduce the chances of relapse, try to:  Avoid drinking alcohol. Drinking lowers your chances of successfully quitting.  Reduce the amount of caffeine you consume. Once you quit smoking, the amount of caffeine in your body increases and can give you symptoms, such as a rapid heartbeat, sweating, and anxiety.  Avoid smokers because they can make you want to smoke.  Do not let weight gain distract you. Many smokers will gain weight when they quit, usually less than 10 pounds. Eat a healthy diet and stay active. You can always lose the weight gained after you quit.  Find ways to improve your mood other than smoking. FOR MORE INFORMATION  www.smokefree.gov  Document Released: 11/20/2001 Document Revised:  05/27/2012 Document Reviewed: 03/06/2012 [facility] Patient Information 2014 [place], [place].

## 2014-01-19 NOTE — Progress Notes (Signed)
Hematology and Oncology Follow Up Visit  Andrew Dalton [id number] May 16, 1991 22 y.o. 01/19/2014   Principle Diagnosis:   Anaplastic large cell T.-cell lymphoma  Current Therapy:    Observation     Interim History:  Andrew Dalton is Andrew Dalton for long-awaited followup. We last saw him back in October of 2013. He had an aggressive T-cell lymphoma. 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 We treated him with 8 cycles of CHOP and he got into remission. He remained in remission.  He's been doing very well since we last saw him. He's being followed by the [place]. He's getting his care from the [place].  He's had no problems with fever. He's had no nausea vomiting. He's had no pain.  He wants to have his Port-A-Cath out. I don't see a problem with this.  He's had no rashes. He's had no change in bowel or bladder habits. He's had no weight loss or weight gain.  Overall, his performance status is ECOG 0.  Medications: Current outpatient prescriptions:Multiple Vitamin (MULTI VITAMIN MENS PO), Take by mouth every morning., Disp: , Rfl: ;  TraZODone & Diet Manage Prod (TRAZAMINE) 50 MG MISC, Take by mouth as needed., Disp: , Rfl:   Allergies:  Allergies  Allergen Reactions  . Penicillins Anaphylaxis  . Bactrim Hives, Itching and Swelling    Past Medical History, Surgical history, Social history, and Family History were reviewed and updated.  Review of Systems: As above  Physical Exam:  height is 6' (1.829 m) and weight is 154 lb (69.854 kg). His oral temperature is 97.4 F (36.3 C). His blood pressure is 134/49 and his pulse is 75. His respiration is 18.   Well-developed well-nourished white male in no obvious distress. He is alert and oriented x3. His head neck exam shows no ocular oral lesions. Specifically, he has no adenopathy on the right neck. He has no palpable thyroid. Lungs are clear. Cardiac exam regular in rhythm with no murmurs rubs or bruits. Abdomen soft. Has good bowel sounds. There is no fluid  wave. There is no palpable hepato- splenomegaly. Axillary exam shows no axillary adenopathy. Inguinal exam shows no inguinal adenopathy. Extremities shows no clubbing cyanosis or edema. Skin exam no rashes ecchymosis or petechia. His Port-A-Cath site is intact. Neurological exam no focal neurological deficits.  Lab Results  Component Value Date   WBC 6.3 01/19/2014   HGB 15.2 01/19/2014   HCT 44.2 01/19/2014   MCV 90 01/19/2014   PLT 199 01/19/2014     Chemistry      Component Value Date/Time   NA 143 10/31/2012 1715   NA 142 10/14/2012 1008   K 3.2* 10/31/2012 1715   K 3.2* 10/14/2012 1008   CL 98 10/31/2012 1715   CL 91* 10/14/2012 1008   CO2 32 10/31/2012 1715   CO2 32 10/14/2012 1008   BUN 13 10/31/2012 1715   BUN 10 10/14/2012 1008   CREATININE 0.91 10/31/2012 1715   CREATININE 2.5* 10/14/2012 1008      Component Value Date/Time   CALCIUM 10.4 10/31/2012 1715   CALCIUM 10.7* 10/14/2012 1008   ALKPHOS 80 10/31/2012 1715   ALKPHOS 75 10/14/2012 1008   AST 17 10/31/2012 1715   AST 25 10/14/2012 1008   ALT 20 10/31/2012 1715   ALT 25 10/14/2012 1008   BILITOT 0.5 10/31/2012 1715   BILITOT 0.80 10/14/2012 1008         Impression and Plan: Andrew Dalton is a 23 year old abdomen. 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 He served in [place]. He has post traumatic stress disorder. This is well compensated right now. I am just so happy that he is doing as well as he is doing.  I don't see any evidence of lymphoma recurrence. I would think that he is likely cured. I think that the risk of his lymphoma going back at this point is going to be less than 5%.  We will get his Port-A-Cath taken out.  Since he is being followed at the [place], I don't think we have to see him back. I will be what happened to see him if any issues arise from a blood or oncologic perspective.  Volanda Napoleon, MD 2/10/201510:24 AM

## 2014-01-19 NOTE — Telephone Encounter (Signed)
Andrew Dalton aware need to send [facility] precert request for port removal. Andrew Dalton at [facility] aware to hold until we get precert from [place]

## 2014-01-22 ENCOUNTER — Telehealth: Payer: Self-pay | Admitting: Hematology & Oncology

## 2014-01-22 NOTE — Telephone Encounter (Signed)
Pt called to check on port precert. He is aware [facility] Flattery has confirmed with [facility] they got the request we are waiting for reply.

## 2014-02-01 ENCOUNTER — Other Ambulatory Visit: Payer: Self-pay | Admitting: Radiology

## 2014-02-01 ENCOUNTER — Telehealth: Payer: Self-pay | Admitting: Hematology & Oncology

## 2014-02-01 NOTE — Telephone Encounter (Signed)
[place]: [id number] Status: Approved Dates: 02/01/2014 - 05/02/2014 Procedure: Port removal

## 2014-02-03 ENCOUNTER — Encounter (HOSPITAL_COMMUNITY): Payer: Self-pay | Admitting: Pharmacist

## 2014-02-04 ENCOUNTER — Ambulatory Visit (HOSPITAL_COMMUNITY)
Admission: RE | Admit: 2014-02-04 | Discharge: 2014-02-04 | Disposition: A | Discharge status: Home / Self Care | Payer: Non-veteran care | Source: Ambulatory Visit | Attending: Hematology & Oncology | Admitting: Hematology & Oncology

## 2014-02-04 ENCOUNTER — Encounter (HOSPITAL_COMMUNITY): Payer: Self-pay

## 2014-02-04 DIAGNOSIS — F17200 Nicotine dependence, unspecified, uncomplicated: Secondary | ICD-10-CM | POA: Insufficient documentation

## 2014-02-04 DIAGNOSIS — Z87898 Personal history of other specified conditions: Secondary | ICD-10-CM | POA: Insufficient documentation

## 2014-02-04 DIAGNOSIS — IMO0002 Reserved for concepts with insufficient information to code with codable children: Secondary | ICD-10-CM

## 2014-02-04 DIAGNOSIS — Z452 Encounter for adjustment and management of vascular access device: Secondary | ICD-10-CM | POA: Insufficient documentation

## 2014-02-04 LAB — CBC WITH DIFFERENTIAL/PLATELET
BASOS ABS: 0 10*3/uL (ref 0.0–0.1)
Basophils Relative: 0 % (ref 0–1)
Eosinophils Absolute: 0.2 10*3/uL (ref 0.0–0.7)
Eosinophils Relative: 3 % (ref 0–5)
HCT: 41.5 % (ref 39.0–52.0)
Hemoglobin: 15.2 g/dL (ref 13.0–17.0)
LYMPHS ABS: 1 10*3/uL (ref 0.7–4.0)
LYMPHS PCT: 14 % (ref 12–46)
MCH: 32.1 pg (ref 26.0–34.0)
MCHC: 36.6 g/dL — ABNORMAL HIGH (ref 30.0–36.0)
MCV: 87.7 fL (ref 78.0–100.0)
Monocytes Absolute: 0.4 10*3/uL (ref 0.1–1.0)
Monocytes Relative: 6 % (ref 3–12)
NEUTROS PCT: 76 % (ref 43–77)
Neutro Abs: 5.1 10*3/uL (ref 1.7–7.7)
PLATELETS: 185 10*3/uL (ref 150–400)
RBC: 4.73 MIL/uL (ref 4.22–5.81)
RDW: 12.4 % (ref 11.5–15.5)
WBC: 6.8 10*3/uL (ref 4.0–10.5)

## 2014-02-04 LAB — PROTIME-INR
INR: 1.02 (ref 0.00–1.49)
PROTHROMBIN TIME: 13.2 s (ref 11.6–15.2)

## 2014-02-04 MED ORDER — MIDAZOLAM HCL 2 MG/2ML IJ SOLN
INTRAMUSCULAR | Status: AC | PRN
  Administered 2014-02-04: 2 mg via INTRAVENOUS

## 2014-02-04 MED ORDER — FENTANYL CITRATE 0.05 MG/ML IJ SOLN
INTRAMUSCULAR | Status: AC
  Filled 2014-02-04: qty 2

## 2014-02-04 MED ORDER — SODIUM CHLORIDE 0.9 % IV SOLN
INTRAVENOUS | Status: DC
  Administered 2014-02-04: 500 mL via INTRAVENOUS

## 2014-02-04 MED ORDER — FENTANYL CITRATE 0.05 MG/ML IJ SOLN
INTRAMUSCULAR | Status: AC | PRN
  Administered 2014-02-04: 100 ug via INTRAVENOUS

## 2014-02-04 MED ORDER — LIDOCAINE HCL 1 % IJ SOLN
INTRAMUSCULAR | Status: AC
  Filled 2014-02-04: qty 20

## 2014-02-04 MED ORDER — VANCOMYCIN HCL IN DEXTROSE 1-5 GM/200ML-% IV SOLN
1000.0000 mg | Freq: Once | INTRAVENOUS | Status: AC
  Administered 2014-02-04: 1000 mg via INTRAVENOUS
  Filled 2014-02-04: qty 200

## 2014-02-04 MED ORDER — MIDAZOLAM HCL 2 MG/2ML IJ SOLN
INTRAMUSCULAR | Status: AC
  Filled 2014-02-04: qty 2
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
## 2014-02-04 NOTE — Procedures (Signed)
PORT removal

## 2014-02-04 NOTE — Discharge Instructions (Signed)
Moderate Sedation, Adult Moderate sedation is given to help you relax or even sleep through a procedure. You may remain sleepy, be clumsy, or have poor balance for several hours following this procedure. Arrange for a responsible adult, family member, or friend to take you home. A responsible adult should stay with you for at least 24 hours or until the medicines have worn off.  Do not participate in any activities where you could become injured for the next 24 hours, or until you feel normal again. Do not:  Drive.  Swim.  Ride a bicycle.  Operate heavy machinery.  Cook.  Use power tools.  Climb ladders.  Work at [employer].  Do not make important decisions or sign legal documents until you are improved.  Vomiting may occur if you eat too soon. When you can drink without vomiting, try water, juice, or soup. Try solid foods if you feel little or no nausea.  Only take over-the-counter or prescription medications for pain, discomfort, or fever as directed by your caregiver.If pain medications have been prescribed for you, ask your caregiver how soon it is safe to take them.  Make sure you and your family fully understands everything about the medication given to you. Make sure you understand what side effects may occur.  You should not drink alcohol, take sleeping pills, or medications that cause drowsiness for at least 24 hours.  If you smoke, do not smoke alone.  If you are feeling better, you may resume normal activities 24 hours after receiving sedation.  Keep all appointments as scheduled. Follow all instructions.  Ask questions if you do not understand. SEEK MEDICAL CARE IF:   Your skin is pale or bluish in color.  You continue to feel sick to your stomach (nauseous) or throw up (vomit).  Your pain is getting worse and not helped by medication.  You have bleeding or swelling.  You are still sleepy or feeling clumsy after 24 hours. SEEK IMMEDIATE MEDICAL CARE IF:    You develop a rash.  You have difficulty breathing.  You develop any type of allergic problem.  You have a fever. Document Released: 08/21/2001 Document Revised: 02/18/2012 Document Reviewed: 08/03/2013 [facility] Patient Information 2014 [place]. Incision Care  An incision is a surgical cut to open your skin. You need to take care of your incision. This helps you to not get an infection. HOME CARE  Only take medicine as told by your doctor.  Do not take off your bandage (dressing) or get your incision wet until your doctor approves. Change the bandage and call your doctor if the bandage gets wet, dirty, or starts to smell.  Take showers. Do not take baths, swim, or do anything that may soak your incision until it heals.  Return to your normal diet and activities as told or allowed by your doctor.  Avoid lifting any weight until your doctor approves.  Put medicine that helps lessen itching on your incision as told by your doctor. Do not pick or scratch at your incision.  Keep your doctor visit to have your stitches (sutures) or staples removed.  Drink enough fluids to keep your pee (urine) clear or pale yellow. GET HELP RIGHT AWAY IF:  You have a fever.  You have a rash.  You are dizzy, or you pass out (faint) while standing.  You have trouble breathing.  You have a reaction or side effects to medicine given to you.  You have redness, puffiness (swelling), or more pain in  the incision and medicine does not help.  You have fluid, blood, or yellowish-white fluid (pus) coming from the incision lasting over 1 day.  You have muscle aches, chills, or you feel sick.  You have a bad smell coming from the incision or bandage.  Your incision opens up after stitches, staples, or sticky strips have been removed.  You keep feeling sick to your stomach (nauseous) or keep throwing up (vomiting). MAKE SURE YOU:   Understand these instructions.  Will watch your  condition.  Will get help right away if you are not doing well or get worse. Document Released: 02/18/2012 Document Reviewed: 02/18/2012 [facility] Patient Information 2014 [place].

## 2015-01-12 ENCOUNTER — Other Ambulatory Visit: Payer: Self-pay | Admitting: Family

## 2015-06-21 ENCOUNTER — Telehealth: Payer: Self-pay | Admitting: Hematology & Oncology

## 2015-06-21 NOTE — Telephone Encounter (Signed)
Tried to call and let pt know their records are ready for pick up. Phone did not accept calls at the time.

## 2016-02-16 ENCOUNTER — Other Ambulatory Visit: Payer: Self-pay | Admitting: Urology

## 2016-02-16 DIAGNOSIS — I861 Scrotal varices: Secondary | ICD-10-CM

## 2016-02-28 ENCOUNTER — Other Ambulatory Visit: Payer: Non-veteran care

## 2017-11-15 ENCOUNTER — Encounter: Payer: Self-pay | Admitting: Physical Therapy

## 2017-11-15 ENCOUNTER — Ambulatory Visit: Payer: Non-veteran care | Attending: Internal Medicine | Admitting: Physical Therapy

## 2017-11-15 DIAGNOSIS — R262 Difficulty in walking, not elsewhere classified: Secondary | ICD-10-CM | POA: Insufficient documentation

## 2017-11-15 DIAGNOSIS — M79652 Pain in left thigh: Secondary | ICD-10-CM | POA: Diagnosis present
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
## 2017-11-29 ENCOUNTER — Encounter: Payer: Self-pay | Admitting: Physical Therapy

## 2017-11-29 ENCOUNTER — Ambulatory Visit: Payer: Non-veteran care | Admitting: Physical Therapy

## 2017-11-29 DIAGNOSIS — R262 Difficulty in walking, not elsewhere classified: Secondary | ICD-10-CM

## 2017-11-29 DIAGNOSIS — M79652 Pain in left thigh: Secondary | ICD-10-CM
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
## 2017-12-06 ENCOUNTER — Encounter: Payer: Self-pay | Admitting: Physical Therapy

## 2017-12-06 ENCOUNTER — Ambulatory Visit: Payer: Non-veteran care | Admitting: Physical Therapy

## 2017-12-06 DIAGNOSIS — M79652 Pain in left thigh: Secondary | ICD-10-CM

## 2017-12-06 DIAGNOSIS — R262 Difficulty in walking, not elsewhere classified: Secondary | ICD-10-CM
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
## 2017-12-20 ENCOUNTER — Encounter: Payer: Self-pay | Admitting: Physical Therapy

## 2017-12-20 ENCOUNTER — Ambulatory Visit: Payer: Non-veteran care | Attending: Internal Medicine | Admitting: Physical Therapy

## 2017-12-20 DIAGNOSIS — R262 Difficulty in walking, not elsewhere classified: Secondary | ICD-10-CM | POA: Diagnosis present

## 2017-12-20 DIAGNOSIS — M79652 Pain in left thigh: Secondary | ICD-10-CM | POA: Insufficient documentation
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
## 2017-12-27 ENCOUNTER — Ambulatory Visit: Payer: Non-veteran care | Admitting: Physical Therapy

## 2018-01-03 ENCOUNTER — Ambulatory Visit: Payer: Non-veteran care | Admitting: Physical Therapy

## 2018-01-03 ENCOUNTER — Encounter: Payer: Self-pay | Admitting: Physical Therapy

## 2018-01-03 DIAGNOSIS — M79652 Pain in left thigh: Secondary | ICD-10-CM | POA: Diagnosis not present

## 2018-01-03 DIAGNOSIS — R262 Difficulty in walking, not elsewhere classified: Secondary | ICD-10-CM
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
## 2018-01-10 ENCOUNTER — Encounter: Payer: Self-pay | Admitting: Physical Therapy

## 2018-01-10 ENCOUNTER — Ambulatory Visit: Payer: Non-veteran care | Attending: Internal Medicine | Admitting: Physical Therapy

## 2018-01-10 DIAGNOSIS — R262 Difficulty in walking, not elsewhere classified: Secondary | ICD-10-CM | POA: Diagnosis present

## 2018-01-10 DIAGNOSIS — M79652 Pain in left thigh: Secondary | ICD-10-CM | POA: Diagnosis not present
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
## 2018-01-17 ENCOUNTER — Ambulatory Visit: Payer: Non-veteran care | Admitting: Physical Therapy

## 2018-04-04 ENCOUNTER — Telehealth: Payer: Self-pay | Admitting: Hematology & Oncology

## 2018-04-04 NOTE — Telephone Encounter (Signed)
Faxed medical records to: [facility] [place]  F: [phone] P: [phone] [id number]        COPY SCANNED
